# Patient Record
Sex: Male | Born: 1985 | Race: White | Hispanic: No | Marital: Single | Smoking: Former smoker
Health system: Southern US, Community
[De-identification: ages and names within clinical notes are randomized; demographics above are authoritative.]

## PROBLEM LIST (undated history)

## (undated) DIAGNOSIS — K8020 Calculus of gallbladder without cholecystitis without obstruction: Secondary | ICD-10-CM

## (undated) HISTORY — DX: Calculus of gallbladder without cholecystitis without obstruction: K80.20

---

## 2008-05-02 ENCOUNTER — Inpatient Hospital Stay (HOSPITAL_COMMUNITY): Admission: EM | Admit: 2008-05-02 | Discharge: 2008-05-06 | Payer: Self-pay | Admitting: Emergency Medicine

## 2008-05-02 ENCOUNTER — Ambulatory Visit: Payer: Self-pay | Admitting: Surgery

## 2008-05-22 ENCOUNTER — Ambulatory Visit: Payer: Self-pay | Admitting: Surgery

## 2008-12-11 ENCOUNTER — Ambulatory Visit: Payer: Self-pay | Admitting: Surgery

## 2009-06-04 ENCOUNTER — Ambulatory Visit: Payer: Self-pay | Admitting: Surgery

## 2009-10-18 ENCOUNTER — Ambulatory Visit: Payer: Self-pay | Admitting: Surgery

## 2010-02-04 ENCOUNTER — Ambulatory Visit: Admit: 2010-02-04 | Payer: Self-pay | Admitting: Surgery

## 2010-02-18 ENCOUNTER — Ambulatory Visit: Payer: Self-pay | Admitting: Surgery

## 2010-05-01 LAB — PROTIME-INR
INR: 1.7 — ABNORMAL HIGH (ref 0.00–1.49)
INR: 1.7 — ABNORMAL HIGH (ref 0.00–1.49)
INR: 1.3 (ref 0.00–1.49)
INR: 1.6 — ABNORMAL HIGH (ref 0.00–1.49)
Prothrombin Time: 20.3 seconds — ABNORMAL HIGH (ref 11.6–15.2)
Prothrombin Time: 21.2 seconds — ABNORMAL HIGH (ref 11.6–15.2)
Prothrombin Time: 17 seconds — ABNORMAL HIGH (ref 11.6–15.2)

## 2010-05-01 LAB — TYPE AND SCREEN
ABO/RH(D): A POS
Antibody Screen: NEGATIVE

## 2010-05-01 LAB — DIFFERENTIAL
Basophils Absolute: 0.1 10*3/uL (ref 0.0–0.1)
Basophils Relative: 1 % (ref 0–1)
Eosinophils Absolute: 0.2 10*3/uL (ref 0.0–0.7)
Eosinophils Relative: 2 % (ref 0–5)
Lymphocytes Relative: 33 % (ref 12–46)
Lymphs Abs: 3.2 10*3/uL (ref 0.7–4.0)
Monocytes Absolute: 0.5 10*3/uL (ref 0.1–1.0)
Monocytes Relative: 5 % (ref 3–12)
Neutro Abs: 5.8 10*3/uL (ref 1.7–7.7)
Neutrophils Relative %: 60 % (ref 43–77)

## 2010-05-01 LAB — BASIC METABOLIC PANEL
BUN: 8 mg/dL (ref 6–23)
CO2: 29 mEq/L (ref 19–32)
CO2: 27 mEq/L (ref 19–32)
Calcium: 7.9 mg/dL — ABNORMAL LOW (ref 8.4–10.5)
Calcium: 7.6 mg/dL — ABNORMAL LOW (ref 8.4–10.5)
Chloride: 108 mEq/L (ref 96–112)
Creatinine, Ser: 0.82 mg/dL (ref 0.4–1.5)
Creatinine, Ser: 0.95 mg/dL (ref 0.4–1.5)
GFR calc Af Amer: >60 mL/min (ref >60)
GFR calc Af Amer: >60 mL/min (ref >60)
GFR calc Af Amer: >60 mL/min (ref >60)
GFR calc non Af Amer: >60 mL/min (ref >60)
GFR calc non Af Amer: >60 mL/min (ref >60)
GFR calc non Af Amer: >60 mL/min (ref >60)
Glucose, Bld: 107 mg/dL — ABNORMAL HIGH (ref 70–99)
Glucose, Bld: 110 mg/dL — ABNORMAL HIGH (ref 70–99)
Potassium: 3.6 mEq/L (ref 3.5–5.1)
Potassium: 3.8 mEq/L (ref 3.5–5.1)
Sodium: 136 mEq/L (ref 135–145)
Sodium: 138 mEq/L (ref 135–145)

## 2010-05-01 LAB — CBC
HCT: 28.2 % — ABNORMAL LOW (ref 39.0–52.0)
HCT: 31.1 % — ABNORMAL LOW (ref 39.0–52.0)
HCT: 41.2 % (ref 39.0–52.0)
Hemoglobin: 9.8 g/dL — ABNORMAL LOW (ref 13.0–17.0)
Hemoglobin: 10.9 g/dL — ABNORMAL LOW (ref 13.0–17.0)
Hemoglobin: 10.9 g/dL — ABNORMAL LOW (ref 13.0–17.0)
Hemoglobin: 14.2 g/dL (ref 13.0–17.0)
MCHC: 34.6 g/dL (ref 30.0–36.0)
MCHC: 34.4 g/dL (ref 30.0–36.0)
MCV: 89.7 fL (ref 78.0–100.0)
MCV: 88.8 fL (ref 78.0–100.0)
Platelets: 127 10*3/uL — ABNORMAL LOW (ref 150–400)
Platelets: 141 10*3/uL — ABNORMAL LOW (ref 150–400)
Platelets: 240 10*3/uL (ref 150–400)
RBC: 3.14 MIL/uL — ABNORMAL LOW (ref 4.22–5.81)
RBC: 3.45 MIL/uL — ABNORMAL LOW (ref 4.22–5.81)
RBC: 4.64 MIL/uL (ref 4.22–5.81)
RDW: 12.9 % (ref 11.5–15.5)
RDW: 12.6 % (ref 11.5–15.5)
RDW: 12.7 % (ref 11.5–15.5)
WBC: 4.7 10*3/uL (ref 4.0–10.5)
WBC: 6.9 10*3/uL (ref 4.0–10.5)
WBC: 9.7 10*3/uL (ref 4.0–10.5)

## 2010-05-01 LAB — ABO/RH: ABO/RH(D): A POS

## 2010-05-01 LAB — POCT I-STAT 7, (LYTES, BLD GAS, ICA,H+H)
Calcium, Ion: 1.06 mmol/L — ABNORMAL LOW (ref 1.12–1.32)
Hemoglobin: 8.8 g/dL — ABNORMAL LOW (ref 13.0–17.0)
O2 Saturation: 100 %
TCO2: 29 mmol/L (ref 0–100)
pCO2 arterial: 50 mmHg — ABNORMAL HIGH (ref 35.0–45.0)
pO2, Arterial: 534 mmHg — ABNORMAL HIGH (ref 80.0–100.0)

## 2010-05-01 LAB — POCT I-STAT 4, (NA,K, GLUC, HGB,HCT)
Glucose, Bld: 98 mg/dL (ref 70–99)
HCT: 35 % — ABNORMAL LOW (ref 39.0–52.0)
Hemoglobin: 11.9 g/dL — ABNORMAL LOW (ref 13.0–17.0)
Potassium: 5.1 mEq/L (ref 3.5–5.1)
Sodium: 139 mEq/L (ref 135–145)

## 2010-05-01 LAB — POCT I-STAT, CHEM 8
Calcium, Ion: 1.05 mmol/L — ABNORMAL LOW (ref 1.12–1.32)
Glucose, Bld: 138 mg/dL — ABNORMAL HIGH (ref 70–99)
HCT: 43 % (ref 39.0–52.0)
Hemoglobin: 14.6 g/dL (ref 13.0–17.0)

## 2010-06-04 NOTE — Op Note (Signed)
NAMEMarland Kitchen  Dean Schultz, Dean Schultz NO.:  [id number]   MEDICAL RECORD NO.:  [id number]          PATIENT TYPE:  INP   LOCATION:  5014                         FACILITY:  [facility]   PHYSICIAN:  Nadara Mustard, MD     DATE OF BIRTH:  01/22/1985   DATE OF PROCEDURE:  DATE OF DISCHARGE:                               OPERATIVE REPORT   PREOPERATIVE DIAGNOSIS:  Open Gustilo-Anderson type IIIc left elbow  supracondylar fracture with intra-articular fracture.   POSTOPERATIVE DIAGNOSIS:  Open Gustilo-Anderson type IIIc left elbow  supracondylar fracture with intra-articular fracture.   PROCEDURES:  1. Irrigation debridement skin, soft tissue, muscle, and bone with      pulsatile lavage.  2. Open reduction and internal fixation left intra-articular distal      humerus fracture.  3. Exploration and transposition of the ulnar nerve.  4. Median nerve repair with a 6 mm x 2.5 cm neurogenic graft.  5. Fasciotomies of the forearm.  6. Carpal tunnel release.  7. Repair of the biceps tendon, and brachialis muscle.  8. Traumatic wound laceration, repaired 10 cm in length.  9. Application of wound vac.  10.Dr. Myra Gianotti, vein graft repair of the brachial artery.   SURGEON:  Nadara Mustard, MD and V. Durene Cal IV, MD   ANESTHESIA:  General.   ESTIMATED BLOOD LOSS:  300 mL.   ANTIBIOTICS:  1 g of Kefzol and 60 mg gentamicin.   TOURNIQUET TIME:  None.   DRAINS:  None.   WOUND VAC:  A set of -125 mmHg.   DISPOSITION:  To PACU in stable condition.  The patient was started with  type and cross and transfused with 2 units of packed red blood cells.   INDICATIONS FOR PROCEDURE:  The patient is a 25 year old gentleman who  was at work.  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  The  patient was brought to the emergency room and was seen urgently by  orthopedic consultation.  The patient states that his injury  occurred at  approximately 11:30 and the patient was seen in the emergency room  approximately 12:30.  The patient was reported to have pulses at the  wrist and some sensation by the ER physician.  My evaluation at initial  presentation showed a large traumatic wound over the antecubital fossa  of the elbow.  The patient had no sensation of the entire hand both  palmar and dorsal.  No sensation of the median, radial, or ulnar nerve  distribution.  The patient did not have palpable pulses at the wrist.  Doppler was used and there was no dopplerable pulses at the radial or  ulnar artery.  Radiograph showed an intra-articular fracture,  supracondylar left elbow.  The patient's remainder of his examination  was essentially unremarkable.  He had no previous surgeries.  No  allergies.  No medications. Isolated injury to the left elbow, no head  trauma.   SOCIAL HISTORY:  The patient does smoke.  ASSESSMENT:  Open Gustilo-Anderson type IIIc left elbow supracondylar  humerus fracture.  Pulseless ischemic left hand without sensation.   PLAN:  Surgery was discussed with the patient and his both parents due  to the complete lack of circulation and complete lack of sensation.  Risks and benefits were discussed including infection, neurovascular  injury, contractures, loss of permanent function to the hand, loss of  circulation to the hand, need for additional surgery, as well as the  need for open reduction and internal fixation of bone, need to repair  the artery, repair the nerve, and repair the muscles.  Parents state  they understood and wished to proceed at this time.  The patient was  brought to the operating room and underwent general anesthetic.  After  adequate level of anesthesia obtained, the patient's arm was first  cleansed with Hibiclens and scrub and then prepped using Betadine paint  and draped into a sterile field.  The traumatic wound extended  transversely across the  antecubital fossa.  This was extended distally  on the radial aspect of the forearm and proximally on the medial aspect  of the humerus.  The skin, soft tissue, muscle and bone was irrigated  with pulse lavage.  Dr. Myra Gianotti was working in conjunction and for his  operative repair of the brachial artery, please see his operative  report.  Due to the complete dysvascular status of the forearm,  fasciotomies were performed for all compartments of the forearm.  The  patient also underwent a carpal tunnel release.  After irrigation  debridement of skin, soft tissue, and bone and after fasciotomies were  performed, the patient underwent open reduction and internal fixation of  the intra-articular fracture of the supracondylar humerus fracture.  This was fixed with a medial locking plate.  Prior to application of the  plate, the ulnar nerve was released and transposed.  After stabilization  of the humerus, Dr. Myra Gianotti repaired the brachial artery.  After repair  the brachial artery, the patient then underwent repair of the biceps  tendon back to its insertion, and fascial repair of the brachialis.  The  patient had a complete disruption of the median nerve with approximately  2 cm of the nerve being crushed.  This was resected and the nerve was  repaired with the elbow flexed without any tension on the arterial  repair or on the nerve repair with the elbow flexed to 90 degrees.  A  neurogen tube was used 6 mm in diameter and 2.5 cm in length, sutured in  place with 6-0 prolene.  The wound was again irrigated with normal  saline.  The brachialis and the biceps were both repaired.  The wounds  were again irrigated.  The traumatic wound, which was over 10 cm in  length was repaired.  The surgical incisions as well as the fasciotomies  were repaired and a wound vac was applied over all of the wounds  including the carpal tunnel and this was set to -125 mmHg continuous,  the wound vac had a good  suction.  The patient was placed in a splint  both sugar-tong and posterior splint to maintain the elbow at 90  degrees.  He was extubated and taken to PACU in stable condition.  The  patient had good dopplerable pulses at the wrist prior to leaving the  operating room.      Nadara Mustard, MD  Electronically Signed     MVD/MEDQ  D:  05/02/2008  T:  05/03/2008  Job:  [id number]

## 2010-06-04 NOTE — Assessment & Plan Note (Signed)
OFFICE VISIT   Dean Schultz, Dean Schultz  DOB:  [birth year]                                       05/22/2008  EAVWU#:[id number]   HISTORY:  This is a 25 year old gentleman who sustained a crush injury  to the left arm.  The bone and nerve components were repaired by Dr.  Lajoyce Corners.  I repaired a transected brachial artery using a segment of  reversed cephalic vein.  The patient comes in today for followup.  He  does have some function of his hand.  He is relatively insensate with  the exception of the dorsum of his hand.  He has a palpable radial  pulse.   With regard to his arterial repair, I would like to see him back in 6  months with a duplex ultrasound.  I have discussed with him the signs  and symptoms of acute thrombosis and told him to come see me emergently.  The patient is about ready to begin exercise rehabilitation.  There was  some redundancy built into his interposition graft.  I think as long as  he proceeds with slow range of motion that he should not have a problem  with regards to his bypass graft.   Jorge Ny, MD  Electronically Signed   VWB/MEDQ  D:  05/22/2008  T:  05/23/2008  Job:  [id number]   cc:   Nadara Mustard, MD

## 2010-06-04 NOTE — Op Note (Signed)
NAME:  Dean Schultz, Dean Schultz                 ACCOUNT NO.:  [id number]   MEDICAL RECORD NO.:  [id number]          PATIENT TYPE:  INP   LOCATION:  5014                         FACILITY:  [facility]   PHYSICIAN:  Juleen China IV, MDDATE OF BIRTH:  [birth year]   DATE OF PROCEDURE:  05/02/2008  DATE OF DISCHARGE:                               OPERATIVE REPORT   PREOPERATIVE DIAGNOSIS:  Ischemic left hand secondary to crush injury.   POSTOPERATIVE DIAGNOSIS:  Ischemic left hand secondary to crush injury.   PROCEDURE PERFORMED:  Left brachial artery bypass graft with ipsilateral  reversed cephalic vein.   SURGEON:  1. Charlena Cross, MD   ASSISTANT:  Jerold Coombe, PA   ANESTHESIA:  General.   BLOOD LOSS:  100 mL.   FINDINGS:  Transected brachial artery, severe crush injury with muscle  bone and nerve injury.   INDICATIONS:  This is a 25 year old gentleman who suffered a crush  injury to his left arm.  In the emergency department, he did not have  any sensation and minimal movement in his left arm.  He was taken to the  operating room by Dr. Lajoyce Corners.  I was asked to repair his brachial artery  injury.   PROCEDURE IN DETAIL:  The patient was identified in the holding area and  taken to operating room and was placed supine on table.  General  endotracheal anesthesia was administered.  The patient's left arm was  prepped and draped in standard sterile fashion.  A time-out was called.  Antibiotics were given.  Dr. Lajoyce Corners first began the procedure opening the  laceration in his arm.  Through this incision, the brachial artery was  identified and it was completely transected.  He placed a vascular clamp  on the proximal brachial artery.  Please see Dr. Audrie Lia note for the  orthopedic details of the procedure.  For my portion of the procedure, I  was able to find the distal brachial artery within the wound.  It was  mobilized distally, approximately 2 cm beyond where the transected end  was  identified.  It was at this level that I felt the artery was  uninjured.  Similarly, I mobilized the proximal brachial artery into an  uninjured portion of the artery where a vascular clamp was placed.  Dr.  Lajoyce Corners plated the distal humerus.  I then mobilized the cephalic vein,  which was in the uninjured portion of the arm.  The vein was  approximately 4 mm in diameter and I felt this was adequate to serve as  a conduit.  I did not feel harvesting the vein from an uninjured  extremity was necessary given this was outside the injured field.  Side  branches on the vein were ligated between 3-0 silk ties.  Once the  adequate amount of vein was obtained, it was ligated proximally and  distally with 2-0 silk ties.  The vein was then prepared on the back  table.  It was instilled with heparinized saline.  It dilated nicely.  Next, the patient was given systemic heparinization.  The injured  portion of the brachial artery was transected.  I then removed  approximately 0.5 cm of normal artery ensuring that I was back to a  healthy brachial artery.  The end was then spatulated.  It was flushed.  There was a good bleeding from the proximal brachial artery.  Next, the  cephalic vein was placed in reversed fashion.  It was also spatulated to  fit the size of the arteriotomy.  A running end-to-end anastomosis was  created with 6-0 Prolene.  After the anastomosis was completed, the  vascular clamp on the brachial artery was released.  There was excellent  pulsatile flow through the artery.  The artery was then reoccluded with  a serrefine clamp.  I then debrided the distal brachial artery removing  approximately a centimeter of uninjured artery ensuring that I was back  to non-traumatized brachial artery.  Approximately, a 5-cm vein conduit  was used.  The end of the brachial artery and the cephalic vein were  spatulated.  An end-to-end anastomosis was created with running 6-0  Prolene.  Prior to  completion, the artery was flushed in antegrade  fashion as well as retrograde fashion.  There was good back-bleeding.  The anastomosis was then secured.  Doppler was used to evaluate signals  in the radial and ulnar artery.  The patient had both radial and ulnar  artery signals.  The anastomosis was then reinspected.  Both ends were  hemostatic.  The patient was given 50 mg protamine to reverse the  heparin.  At this point, this concluded my portion of the procedure.  Dr. Lajoyce Corners continued to work on nerve and bone at this point in time.  Please see his operative note for the remaining details of the  procedure.      Jorge Ny, MD  Electronically Signed     VWB/MEDQ  D:  05/02/2008  T:  05/03/2008  Job:  [id number]

## 2010-06-04 NOTE — Consult Note (Signed)
NAME:  Dean Schultz, Dean Schultz                 ACCOUNT NO.:  [id number]   MEDICAL RECORD NO.:  [id number]          PATIENT TYPE:  INP   LOCATION:  5014                         FACILITY:  [facility]   PHYSICIAN:  Juleen China IV, MDDATE OF BIRTH:  Mar 23, 1985   DATE OF CONSULTATION:  05/02/2008  DATE OF DISCHARGE:                                 CONSULTATION   CONSULTING PHYSICIAN:  Nadara Mustard, MD   REASON FOR CONSULT:  Ischemic left arm.   HISTORY:  This is a 25 year old gentleman who suffered a crush injury to  his left arm.  The injury occurred just prior to his arrival in the  emergency department.  He came with a laceration and bleeding.  He is  unable to feel his hand.  He had minimal movement in his fingers.   Past medical history is none.   Medications are none.   Surgical history is none.   Social history is no drug abuse.   Allergies are none.   Review of systems is negative except for pain and no movement of the  hand.   PHYSICAL EXAMINATION:  GENERAL:  The patient is afebrile,  hemodynamically stable.  Generally, well developed, well nourished.  He  is in mild distress.  HEENT:  Normocephalic, atraumatic.  Pupils are equal.  NECK:  Supple.  RESPIRATORY:  Not labored.  CARDIOVASCULAR:  Regular rate and rhythm.  EXTREMITIES:  He has a laceration with bleeding from the antecubital  crease.  The patient does not have Doppler signals in his radial or  ulnar artery.  He has sluggish cap refill.  He cannot feel his fingers.  He can barely move his fingers.  He is in significant pain.   LABORATORY VALUES:  His hematocrit is 43.   ASSESSMENT AND PLAN:  Crush injury to the left hand.  There is concern  for arterial injury as the patient has what appears to be an ischemic  left hand.  He will be taken emergently to the operating room for  exploration and possible repair.      Jorge Ny, MD  Electronically Signed     VWB/MEDQ  D:  05/02/2008  T:  05/03/2008   Job:  [id number]

## 2010-06-04 NOTE — Procedures (Signed)
VASCULAR LAB EXAM   INDICATION:  Left brachial artery bypass graft with stable pain.   HISTORY:  Diabetes:  No.  Cardiac:  No.  Hypertension:  No.   EXAM:  Duplex, left upper extremity bypass graft.   IMPRESSION:  Patent left brachial artery bypass graft with no evidence  of stenosis.       _________________________________________  V. Charlena Cross, MD   AS/MEDQ  D:  06/04/2009  T:  06/04/2009  Job:  [id number]

## 2010-06-07 NOTE — Discharge Summary (Signed)
NAMEMarland Kitchen  MCLANE, ARORA NO.:  [id number]   MEDICAL RECORD NO.:  [id number]          PATIENT TYPE:  INP   LOCATION:  5014                         FACILITY:  [facility]   PHYSICIAN:  Nadara Mustard, MD     DATE OF BIRTH:  February 22, 1985   DATE OF ADMISSION:  05/02/2008  DATE OF DISCHARGE:  05/06/2008                               DISCHARGE SUMMARY   DIAGNOSIS:  Open fracture and dislocation left elbow with pulseless and  insensate left upper extremity.  Discharged to home in stable condition.   CONSULTATION:  1. Durene Cal IV, MD, vascular vein specialist.   MEDICATIONS:  Tylox, Lyrica, and Cymbalta.   Follow up in the office in 1 week.   HISTORY OF PRESENT ILLNESS:  The patient is a 25 year old gentleman who  was at work and sustained a crush injury of a concrete truck and a wall  sustaining an open fracture and dislocation with complete nerve and  artery disruption at the elbow.  The patient was urgently seen in the  emergency room as well as with vascular veins specialist consultation  and the patient was urgently brought to the OR for limb salvage of the  left upper extremity.  The patient's initial presentation showed a  pulseless insensate left upper extremity, a Gustilo-Anderson grade IIIC  fracture.  The patient's hospital course was essentially unremarkable.  He underwent irrigation and debridement of skin, soft tissue, muscle,  and bone.  He underwent open reduction and internal fixation of the left  distal humerus with application of a wound VAC.  He will underwent  exploration and transposition of the ulnar nerve.  The median nerve was  repaired with a 6 x 2.5-cm NeuroGen graft.  He underwent fasciotomies  and carpal tunnel release due to the pending compartment syndrome  secondary to the ischemia to the left upper extremity.  He underwent  repair of the biceps and brachialis as well as traumatic repair of a 10-  cm traumatic wound.  Dr. Juleen China  underwent a reconstruction  with vein graft of the brachial artery.  The patient received a Kefzol  for infection prophylaxis.  The patient was typed and crossed with 2  units of packed red blood cells.  Postoperatively, the patient  progressed well.  He had a good vascular inflow to the left upper  extremity.  His ulnar sensation did return; however, the median nerve  distribution did not show any sensory function, which was to be  expected.  His hemoglobin on postop day #2 was 10.9.  The wound Vac was  changed on postoperative day #2.  The patient showed no signs of  compartment syndrome.  He was kept on Kefzol and gentamicin with  anticipated discontinuing the wound VAC on Saturday for discharge to  home on Saturday.  The patient was discharged to home in stable  condition on May 06, 2008 with prescription for Tylox, Lyrica, and  Cymbalta with the wound VAC discontinued.  Sterile dressing was applied  and the elbow kept in flexion at 90 degrees to protect the nerve  and  vascular repair.      Nadara Mustard, MD  Electronically Signed     MVD/MEDQ  D:  07/05/2008  T:  07/05/2008  Job:  [id number]

## 2011-02-07 ENCOUNTER — Telehealth: Payer: Self-pay | Admitting: Family Medicine

## 2011-02-07 NOTE — Telephone Encounter (Signed)
Medical Records requested by Franchot Mimes. [employer] [phone] via Certified Mail.  Called Paralegal, Meredith Mody, and followed up.  Does not appear patient has been seen at [facility] location recently and explained that we would need another medical records release form from where patient had signed the release forms and originally requested records in October, 2012.  I will forward the certified letter and release to medical records upon receipt.  In meantime can you please review chart for previous requests from October of 2012 and follow up with the law firm and Trula Ore on status of records.? Thanks

## 2011-02-10 NOTE — Telephone Encounter (Signed)
Gena, Should I be working on this? Aram Beecham

## 2011-09-26 ENCOUNTER — Encounter: Payer: Self-pay | Admitting: Vascular Surgery

## 2018-07-09 ENCOUNTER — Encounter: Payer: Self-pay | Admitting: Emergency Medicine

## 2018-07-09 ENCOUNTER — Emergency Department
Admission: EM | Admit: 2018-07-09 | Discharge: 2018-07-09 | Disposition: A | Discharge status: Home / Self Care | Payer: 59 | Attending: Emergency Medicine | Admitting: Emergency Medicine

## 2018-07-09 ENCOUNTER — Other Ambulatory Visit: Payer: Self-pay

## 2018-07-09 ENCOUNTER — Emergency Department: Payer: 59

## 2018-07-09 DIAGNOSIS — R1011 Right upper quadrant pain: Secondary | ICD-10-CM | POA: Diagnosis not present

## 2018-07-09 LAB — COMPREHENSIVE METABOLIC PANEL
ALT: 43 U/L (ref 0–44)
AST: 33 U/L (ref 15–41)
Albumin: 4.5 g/dL (ref 3.5–5.0)
Alkaline Phosphatase: 66 U/L (ref 38–126)
Anion gap: 10 (ref 5–15)
BUN: 22 mg/dL — ABNORMAL HIGH (ref 6–20)
CO2: 25 mmol/L (ref 22–32)
Calcium: 9.3 mg/dL (ref 8.9–10.3)
Chloride: 103 mmol/L (ref 98–111)
Creatinine, Ser: 1.2 mg/dL (ref 0.61–1.24)
GFR calc Af Amer: >60 mL/min (ref >60)
GFR calc non Af Amer: >60 mL/min (ref >60)
Glucose, Bld: 134 mg/dL — ABNORMAL HIGH (ref 70–99)
Potassium: 3.6 mmol/L (ref 3.5–5.1)
Sodium: 138 mmol/L (ref 135–145)
Total Bilirubin: 0.7 mg/dL (ref 0.3–1.2)
Total Protein: 7.8 g/dL (ref 6.5–8.1)

## 2018-07-09 LAB — LIPASE, BLOOD: Lipase: 48 U/L (ref 11–51)

## 2018-07-09 LAB — URINALYSIS, COMPLETE (UACMP) WITH MICROSCOPIC
Bacteria, UA: NONE SEEN
Bilirubin Urine: NEGATIVE
Glucose, UA: NEGATIVE mg/dL
Hgb urine dipstick: NEGATIVE
Ketones, ur: NEGATIVE mg/dL
Leukocytes,Ua: NEGATIVE
Nitrite: NEGATIVE
Protein, ur: NEGATIVE mg/dL
Specific Gravity, Urine: 1.021 (ref 1.005–1.030)
pH: 6 (ref 5.0–8.0)

## 2018-07-09 LAB — CBC WITH DIFFERENTIAL/PLATELET
Abs Immature Granulocytes: 0.03 10*3/uL (ref 0.00–0.07)
Basophils Absolute: 0.1 10*3/uL (ref 0.0–0.1)
Basophils Relative: 1 %
Eosinophils Absolute: 0.3 10*3/uL (ref 0.0–0.5)
Eosinophils Relative: 4 %
HCT: 45.4 % (ref 39.0–52.0)
Hemoglobin: 15.4 g/dL (ref 13.0–17.0)
Immature Granulocytes: 0 %
Lymphocytes Relative: 36 %
Lymphs Abs: 3.3 10*3/uL (ref 0.7–4.0)
MCH: 29.7 pg (ref 26.0–34.0)
MCHC: 33.9 g/dL (ref 30.0–36.0)
MCV: 87.6 fL (ref 80.0–100.0)
Monocytes Absolute: 0.6 10*3/uL (ref 0.1–1.0)
Monocytes Relative: 7 %
Neutro Abs: 4.7 10*3/uL (ref 1.7–7.7)
Neutrophils Relative %: 52 %
Platelets: 205 10*3/uL (ref 150–400)
RBC: 5.18 MIL/uL (ref 4.22–5.81)
RDW: 11.8 % (ref 11.5–15.5)
WBC: 9.1 10*3/uL (ref 4.0–10.5)
nRBC: 0 % (ref 0.0–0.2)

## 2018-07-09 LAB — TROPONIN I: Troponin I: <0.03 ng/mL (ref <0.03)

## 2018-07-09 MED ORDER — FAMOTIDINE IN NACL 20-0.9 MG/50ML-% IV SOLN
20.0000 mg | Freq: Once | INTRAVENOUS | Status: AC
  Administered 2018-07-09: 20 mg via INTRAVENOUS
  Filled 2018-07-09: qty 50

## 2018-07-09 MED ORDER — SODIUM CHLORIDE 0.9 % IV BOLUS
1000.0000 mL | Freq: Once | INTRAVENOUS | Status: AC
  Administered 2018-07-09: 1000 mL via INTRAVENOUS

## 2018-07-09 MED ORDER — ONDANSETRON HCL 4 MG/2ML IJ SOLN
4.0000 mg | Freq: Once | INTRAMUSCULAR | Status: AC
  Administered 2018-07-09: 4 mg via INTRAVENOUS
  Filled 2018-07-09: qty 2

## 2018-07-09 MED ORDER — FAMOTIDINE 20 MG PO TABS: 20.0000 mg | ORAL_TABLET | Freq: Two times a day (BID) | ORAL | 0 refills | Status: AC

## 2018-07-09 NOTE — Discharge Instructions (Addendum)
1.  Start Pepcid 20 mg twice daily. 2.  Eat a bland diet x5 days, then slowly advance diet as tolerated. 3.  Return to the ER for worsening symptoms, persistent vomiting, difficulty breathing or other concerns.

## 2018-07-09 NOTE — ED Notes (Signed)
Patient returned from ultrasound.

## 2018-07-09 NOTE — ED Provider Notes (Signed)
[facility] Emergency Department Provider Note   ____________________________________________   First MD Initiated Contact with Patient 07/09/18 [phone]     (approximate)  I have reviewed the triage vital signs and the nursing notes.   HISTORY  Chief Complaint Abdominal Pain    HPI Dean Wender. is a 33 y.o. male who presents to the ED from home with a chief complaint of abdominal pain.  Patient reports right upper quadrant abdominal pain after eating hamburger, macaroni and cheese and ice cream cake.  Had one episode of vomiting.  Describes pain as aching as well as burning.  Denies recent fever, cough, chest pain, shortness of breath, diarrhea.  Denies recent travel, trauma or exposure to persons diagnosed with coronavirus.       Past medical history None  There are no active problems to display for this patient.   History reviewed. No pertinent surgical history.  Prior to Admission medications   Medication Sig Start Date End Date Taking? Authorizing Provider  famotidine (PEPCID) 20 MG tablet Take 1 tablet (20 mg total) by mouth 2 (two) times daily. 07/09/18   Paulette Blanch, MD    Allergies Patient has no known allergies.  No family history on file.  Social History Social History   Tobacco Use   Smoking status: Not on file  Substance Use Topics   Alcohol use: Not on file   Drug use: Not on file    Review of Systems  Constitutional: No fever/chills Eyes: No visual changes. ENT: No sore throat. Cardiovascular: Denies chest pain. Respiratory: Denies shortness of breath. Gastrointestinal: Positive for abdominal pain, nausea and vomiting.  No diarrhea.  No constipation. Genitourinary: Negative for dysuria. Musculoskeletal: Negative for back pain. Skin: Negative for rash. Neurological: Negative for headaches, focal weakness or numbness.   ____________________________________________   PHYSICAL EXAM:  VITAL SIGNS: ED Triage  Vitals  Enc Vitals Group     BP 07/09/18 0026 131/74     Pulse Rate 07/09/18 0026 60     Resp 07/09/18 0026 16     Temp 07/09/18 0026 97.9 F (36.6 C)     Temp Source 07/09/18 0026 Oral     SpO2 07/09/18 0026 100 %     Weight 07/09/18 0022 250 lb (113.4 kg)     Height 07/09/18 0022 5\' 10"  (1.778 m)     Head Circumference --      Peak Flow --      Pain Score 07/09/18 0021 8     Pain Loc --      Pain Edu? --      Excl. in [place]? --     Constitutional: Alert and oriented. Well appearing and in no acute distress. Eyes: Conjunctivae are normal. PERRL. EOMI. Head: Atraumatic. Nose: No congestion/rhinnorhea. Mouth/Throat: Mucous membranes are moist.  Oropharynx non-erythematous. Neck: No stridor.   Cardiovascular: Normal rate, regular rhythm. Grossly normal heart sounds.  Good peripheral circulation. Respiratory: Normal respiratory effort.  No retractions. Lungs CTAB. Gastrointestinal: Soft and mildly tender to palpation right upper quadrant without rebound or guarding. No distention. No abdominal bruits. No CVA tenderness. Musculoskeletal: No lower extremity tenderness nor edema.  No joint effusions. Neurologic:  Normal speech and language. No gross focal neurologic deficits are appreciated. No gait instability. Skin:  Skin is warm, dry and intact. No rash noted. Psychiatric: Mood and affect are normal. Speech and behavior are normal.  ____________________________________________   LABS (all labs ordered are listed, but only abnormal results  are displayed)  Labs Reviewed  COMPREHENSIVE METABOLIC PANEL - Abnormal; Notable for the following components:      Result Value   Glucose, Bld 134 (*)    BUN 22 (*)    All other components within normal limits  URINALYSIS, COMPLETE (UACMP) WITH MICROSCOPIC - Abnormal; Notable for the following components:   Color, Urine YELLOW (*)    APPearance HAZY (*)    All other components within normal limits  CBC WITH DIFFERENTIAL/PLATELET  LIPASE,  BLOOD  TROPONIN I   ____________________________________________  EKG  ED ECG REPORT I, Reniya Mcclees J, the attending physician, personally viewed and interpreted this ECG.   Date: 07/09/2018  EKG Time: 0033  Rate: 71  Rhythm: normal EKG, normal sinus rhythm  Axis: Normal  Intervals:none  ST&T Change: Nonspecific  ____________________________________________  RADIOLOGY  ED MD interpretation: Gallbladder polyp  Official radiology report(s): Koreas Abdomen Limited Ruq  Result Date: 07/09/2018 CLINICAL DATA:  Right upper quadrant pain. EXAM: ULTRASOUND ABDOMEN LIMITED RIGHT UPPER QUADRANT COMPARISON:  No prior. FINDINGS: Gallbladder: No gallstones noted. 4.5 mm non mobile, nonshadowing echo density noted the gallbladder consistent with a polyp. Gallbladder wall thickness 2.5 mm. Negative Murphy sign. Common bile duct: Diameter: 3.1 mm Liver: Increased echogenicity consistent fatty infiltration and/or hepatocellular disease. Portal vein is patent on color Doppler imaging with normal direction of blood flow towards the liver. IMPRESSION: 1. Small 4.5 mm gallbladder polyp. No gallstones or biliary distention. 2. Increased echogenicity liver consistent fatty infiltration or hepatocellular disease. Electronically Signed   By: Maisie Fushomas  Register   On: 07/09/2018 06:02    ____________________________________________   PROCEDURES  Procedure(s) performed (including Critical Care):  Procedures   ____________________________________________   INITIAL IMPRESSION / ASSESSMENT AND PLAN / ED COURSE  As part of my medical decision making, I reviewed the following data within the electronic MEDICAL RECORD NUMBER Nursing notes reviewed and incorporated, Labs reviewed, EKG interpreted, Old chart reviewed, Radiograph reviewed and Notes from prior ED visits     Dean BerkshireJohn R Goings Jr. was evaluated in Emergency Department on 07/09/2018 for the symptoms described in the history of present illness. He was evaluated  in the context of the global COVID-19 pandemic, which necessitated consideration that the patient might be at risk for infection with the SARS-CoV-2 virus that causes COVID-19. Institutional protocols and algorithms that pertain to the evaluation of patients at risk for COVID-19 are in a state of rapid change based on information released by regulatory bodies including the CDC and federal and state organizations. These policies and algorithms were followed during the patient's care in the ED.   33 year old male who presents to the ED for RUQ abdominal pain. Differential diagnosis includes, but is not limited to, biliary disease (biliary colic, acute cholecystitis, cholangitis, choledocholithiasis, etc), intrathoracic causes for epigastric abdominal pain including ACS, gastritis, duodenitis, pancreatitis, small bowel or large bowel obstruction, abdominal aortic aneurysm, hernia, and ulcer(s).  Laboratory results unremarkable.  Will initiate IV fluid resuscitation, IV Pepcid, Zofran and proceed with right upper quadrant abdominal ultrasound to evaluate cholecystitis.   Clinical Course as of Jul 08 612  Fri Jul 09, 2018  0609 Updated patient of ultrasound result.  He is resting comfortably no acute distress.  Will start daily Pepcid and he will follow-up with his PCP closely.  Strict return precautions given.  Patient verbalizes understanding and agrees with plan of care.   [JS]    Clinical Course User Index [JS] Irean HongSung, Myrick Mcnairy J, MD  ____________________________________________   FINAL CLINICAL IMPRESSION(S) / ED DIAGNOSES  Final diagnoses:  Right upper quadrant abdominal pain     ED Discharge Orders         Ordered    famotidine (PEPCID) 20 MG tablet  2 times daily     07/09/18 [id number]           Note:  This document was prepared using Dragon voice recognition software and may include unintentional dictation errors.   Irean HongSung, Xaivier Malay J, MD 07/09/18 [phone]

## 2018-07-09 NOTE — ED Notes (Signed)
Patient states having upper stomach pain around 2000 last night until around 0130-0200 with pain around a 8/10. Patient states pain is not as bad as it was earlier pain around 1/10. Patient had to episodes of emesis during episode.

## 2018-07-09 NOTE — ED Triage Notes (Signed)
Patient ambulatory to triage with steady gait, without difficulty or distress noted, in mask in place; pt reports upper abd pain radiating into back accomp by N/V x 3 hrs; denies hx of same

## 2018-11-01 ENCOUNTER — Emergency Department
Admission: EM | Admit: 2018-11-01 | Discharge: 2018-11-01 | Disposition: A | Discharge status: Home / Self Care | Payer: Self-pay | Attending: Emergency Medicine | Admitting: Emergency Medicine

## 2018-11-01 ENCOUNTER — Emergency Department: Payer: Self-pay

## 2018-11-01 ENCOUNTER — Encounter: Payer: Self-pay | Admitting: Emergency Medicine

## 2018-11-01 ENCOUNTER — Other Ambulatory Visit: Payer: Self-pay

## 2018-11-01 DIAGNOSIS — Z87891 Personal history of nicotine dependence: Secondary | ICD-10-CM | POA: Insufficient documentation

## 2018-11-01 DIAGNOSIS — K219 Gastro-esophageal reflux disease without esophagitis: Secondary | ICD-10-CM | POA: Insufficient documentation

## 2018-11-01 DIAGNOSIS — R1013 Epigastric pain: Secondary | ICD-10-CM | POA: Insufficient documentation

## 2018-11-01 LAB — BASIC METABOLIC PANEL
Anion gap: 7 (ref 5–15)
BUN: 18 mg/dL (ref 6–20)
CO2: 29 mmol/L (ref 22–32)
Calcium: 9.3 mg/dL (ref 8.9–10.3)
Chloride: 103 mmol/L (ref 98–111)
Creatinine, Ser: 1.49 mg/dL — ABNORMAL HIGH (ref 0.61–1.24)
GFR calc Af Amer: >60 mL/min (ref >60)
GFR calc non Af Amer: >60 mL/min (ref >60)
Glucose, Bld: 128 mg/dL — ABNORMAL HIGH (ref 70–99)
Potassium: 3.8 mmol/L (ref 3.5–5.1)
Sodium: 139 mmol/L (ref 135–145)

## 2018-11-01 LAB — LIPASE, BLOOD: Lipase: 36 U/L (ref 11–51)

## 2018-11-01 LAB — CBC
HCT: 45 % (ref 39.0–52.0)
Hemoglobin: 15.6 g/dL (ref 13.0–17.0)
MCH: 29.9 pg (ref 26.0–34.0)
MCHC: 34.7 g/dL (ref 30.0–36.0)
MCV: 86.2 fL (ref 80.0–100.0)
Platelets: 208 10*3/uL (ref 150–400)
RBC: 5.22 MIL/uL (ref 4.22–5.81)
RDW: 11.5 % (ref 11.5–15.5)
WBC: 9.6 10*3/uL (ref 4.0–10.5)
nRBC: 0 % (ref 0.0–0.2)

## 2018-11-01 LAB — HEPATIC FUNCTION PANEL
ALT: 30 U/L (ref 0–44)
AST: 28 U/L (ref 15–41)
Albumin: 4.3 g/dL (ref 3.5–5.0)
Alkaline Phosphatase: 58 U/L (ref 38–126)
Bilirubin, Direct: 0.2 mg/dL (ref 0.0–0.2)
Indirect Bilirubin: 0.6 mg/dL (ref 0.3–0.9)
Total Bilirubin: 0.8 mg/dL (ref 0.3–1.2)
Total Protein: 7.1 g/dL (ref 6.5–8.1)

## 2018-11-01 LAB — TROPONIN I (HIGH SENSITIVITY)
Troponin I (High Sensitivity): 8 ng/L (ref <18)
Troponin I (High Sensitivity): 3 ng/L (ref <18)

## 2018-11-01 MED ORDER — METOCLOPRAMIDE HCL 5 MG/ML IJ SOLN
10.0000 mg | Freq: Once | INTRAMUSCULAR | Status: AC
  Administered 2018-11-01: 10 mg via INTRAVENOUS
  Filled 2018-11-01: qty 2

## 2018-11-01 MED ORDER — DROPERIDOL 2.5 MG/ML IJ SOLN
2.5000 mg | Freq: Once | INTRAMUSCULAR | Status: AC
  Administered 2018-11-01: 2.5 mg via INTRAVENOUS
  Filled 2018-11-01: qty 2

## 2018-11-01 MED ORDER — SODIUM CHLORIDE 0.9 % IV BOLUS
1000.0000 mL | Freq: Once | INTRAVENOUS | Status: AC
  Administered 2018-11-01: 1000 mL via INTRAVENOUS

## 2018-11-01 MED ORDER — PANTOPRAZOLE SODIUM 40 MG PO TBEC: 40.0000 mg | DELAYED_RELEASE_TABLET | Freq: Every day | ORAL | 1 refills | Status: AC

## 2018-11-01 MED ORDER — FAMOTIDINE IN NACL 20-0.9 MG/50ML-% IV SOLN
20.0000 mg | Freq: Once | INTRAVENOUS | Status: AC
  Administered 2018-11-01: 20 mg via INTRAVENOUS
  Filled 2018-11-01: qty 50

## 2018-11-01 MED ORDER — ONDANSETRON HCL 4 MG/2ML IJ SOLN
4.0000 mg | Freq: Once | INTRAMUSCULAR | Status: AC
  Administered 2018-11-01: 4 mg via INTRAVENOUS

## 2018-11-01 MED ORDER — FENTANYL CITRATE (PF) 100 MCG/2ML IJ SOLN
50.0000 ug | Freq: Once | INTRAMUSCULAR | Status: AC
  Administered 2018-11-01: 50 ug via INTRAVENOUS
  Filled 2018-11-01: qty 2

## 2018-11-01 MED ORDER — ONDANSETRON HCL 4 MG/2ML IJ SOLN
INTRAMUSCULAR | Status: AC
  Filled 2018-11-01: qty 2

## 2018-11-01 MED ORDER — FENTANYL CITRATE (PF) 100 MCG/2ML IJ SOLN
50.0000 ug | Freq: Once | INTRAMUSCULAR | Status: AC
  Administered 2018-11-01: 03:00:00 50 ug via INTRAVENOUS
  Filled 2018-11-01: qty 2

## 2018-11-01 NOTE — ED Triage Notes (Signed)
Pt arrives POV to triage with c/o upeer epigastric pain which radiates to his back. Pt was awoken from his sleep and vomited with the pain. Pt is diaphoretic at this time in triage.

## 2018-11-01 NOTE — Discharge Instructions (Signed)
Follow-up with GI.  Take Protonix every evening.  Return to the emergency room for new or worsening abdominal pain, chest pain, fever.

## 2018-11-01 NOTE — ED Notes (Signed)
US in with patient.

## 2018-11-01 NOTE — ED Provider Notes (Signed)
[facility] Emergency Department Provider Note  ____________________________________________  Time seen: Approximately 3:12 AM  I have reviewed the triage vital signs and the nursing notes.   HISTORY  Chief Complaint Chest Pain   HPI Dean Schultz. is a 33 y.o. male with no known past medical history who presents for evaluation of epigastric abdominal pain.  Patient describes the pain as burning and sharp, constant and severe radiating to his back and associated with nausea and several episodes of nonbloody nonbilious emesis.  Patient reports that he had pot roast this evening and the pain started a couple of hours after that.  No diarrhea, no chest pain or shortness of breath, no fever cough, no constipation, no prior abdominal surgeries.  Patient is a former smoker.  Denies alcohol or NSAID use, denies history of ulcer disease.  Denies any drug use.  No personal or family history of heart attacks, PE or DVT, no recent travel immobilization, no leg pain or swelling, no hemoptysis or exogenous hormones.  Patient was seen here back in June for similar complaint and at that time was sent for right upper quadrant ultrasound which showed a gallbladder polyp but no other acute findings.   PMH None - reviewed  Prior to Admission medications   Medication Sig Start Date End Date Taking? Authorizing Provider  famotidine (PEPCID) 20 MG tablet Take 1 tablet (20 mg total) by mouth 2 (two) times daily. 07/09/18   Irean Hong, MD  pantoprazole (PROTONIX) 40 MG tablet Take 1 tablet (40 mg total) by mouth at bedtime. 11/01/18 11/01/19  Nita Sickle, MD    Allergies Patient has no known allergies.  No family history on file.  Social History Social History   Tobacco Use   Smoking status: Former Smoker   Smokeless tobacco: Never Used  Substance Use Topics   Alcohol use: Never    Frequency: Never   Drug use: Never    Review of Systems  Constitutional:  Negative for fever. Eyes: Negative for visual changes. ENT: Negative for sore throat. Neck: No neck pain  Cardiovascular: Negative for chest pain. Respiratory: Negative for shortness of breath. Gastrointestinal: + epigastric abdominal pain, nausea, and vomiting. No diarrhea. Genitourinary: Negative for dysuria. Musculoskeletal: Negative for back pain. Skin: Negative for rash. Neurological: Negative for headaches, weakness or numbness. Psych: No SI or HI  ____________________________________________   PHYSICAL EXAM:  VITAL SIGNS: ED Triage Vitals  Enc Vitals Group     BP 11/01/18 0226 128/78     Pulse Rate 11/01/18 0226 70     Resp 11/01/18 0226 18     Temp 11/01/18 0226 98 F (36.7 C)     Temp Source 11/01/18 0226 Oral     SpO2 11/01/18 0226 99 %     Weight 11/01/18 0225 240 lb (108.9 kg)     Height 11/01/18 0225 5\' 10"  (1.778 m)     Head Circumference --      Peak Flow --      Pain Score 11/01/18 0225 7     Pain Loc --      Pain Edu? --      Excl. in [place]? --     Constitutional: Alert and oriented, clammy and actively vomiting.  HEENT:      Head: Normocephalic and atraumatic.         Eyes: Conjunctivae are normal. Sclera is non-icteric.       Mouth/Throat: Mucous membranes are moist.  Neck: Supple with no signs of meningismus. Cardiovascular: Regular rate and rhythm. No murmurs, gallops, or rubs. 2+ symmetrical distal pulses are present in all extremities. No JVD. Respiratory: Normal respiratory effort. Lungs are clear to auscultation bilaterally. No wheezes, crackles, or rhonchi.  Gastrointestinal: Soft, tender to palpation in all upper quadrants worse in the RUQ and epigastric region, and non distended with positive bowel sounds. No rebound or guarding. Genitourinary: No CVA tenderness. Musculoskeletal: Nontender with normal range of motion in all extremities. No edema, cyanosis, or erythema of extremities. Neurologic: Normal speech and language. Face is  symmetric. Moving all extremities. No gross focal neurologic deficits are appreciated. Skin: Skin is warm, dry and intact. No rash noted. Psychiatric: Mood and affect are normal. Speech and behavior are normal.  ____________________________________________   LABS (all labs ordered are listed, but only abnormal results are displayed)  Labs Reviewed  BASIC METABOLIC PANEL - Abnormal; Notable for the following components:      Result Value   Glucose, Bld 128 (*)    Creatinine, Ser 1.49 (*)    All other components within normal limits  CBC  HEPATIC FUNCTION PANEL  LIPASE, BLOOD  TROPONIN I (HIGH SENSITIVITY)  TROPONIN I (HIGH SENSITIVITY)   ____________________________________________  EKG  ED ECG REPORT I, Nita Sicklearolina Kourtlynn Trevor, the attending physician, personally viewed and interpreted this ECG.  Normal sinus rhythm, rate of 70, normal intervals, normal axis, no ST elevations or depressions. ____________________________________________  RADIOLOGY  I have personally reviewed the images performed during this visit and I agree with the Radiologist's read.   Interpretation by Radiologist:  Dg Chest 2 View  Result Date: 11/01/2018 CLINICAL DATA:  Chest pain EXAM: CHEST - 2 VIEW COMPARISON:  None. FINDINGS: No consolidation, features of edema, pneumothorax, or effusion. Pulmonary vascularity is normally distributed. The cardiomediastinal contours are unremarkable. No acute osseous or soft tissue abnormality. IMPRESSION: No acute cardiopulmonary abnormality. Electronically Signed   By: Kreg ShropshirePrice  DeHay M.D.   On: 11/01/2018 02:51   [place] Abdomen Limited Ruq  Result Date: 11/01/2018 CLINICAL DATA:  Epigastric abdominal pain with nausea and vomiting EXAM: ULTRASOUND ABDOMEN LIMITED RIGHT UPPER QUADRANT COMPARISON:  07/09/2018 FINDINGS: Gallbladder: No gallstones or wall thickening visualized. No sonographic Murphy sign noted by sonographer. Previously reported polyp is not seen today Common  bile duct: Diameter: 4 mm Liver: Increased liver echogenicity. No focal lesion. Portal vein is patent on color Doppler imaging with normal direction of blood flow towards the liver. IMPRESSION: 1. No acute finding or gallstone. 2. Hepatic steatosis. Electronically Signed   By: Marnee SpringJonathon  Watts M.D.   On: 11/01/2018 05:23     ____________________________________________   PROCEDURES  Procedure(s) performed: None Procedures Critical Care performed:  None ____________________________________________   INITIAL IMPRESSION / ASSESSMENT AND PLAN / ED COURSE   33 y.o. male with no known past medical history who presents for evaluation of epigastric abdominal pain, nausea vomiting.  Patient is clammy and actively vomiting, tender to palpation in the upper quadrants worse in the right upper quadrant and epigastric region with no rebound or guarding.  EKG with no ischemic changes.  Differential diagnosis including GERD versus peptic ulcer disease versus pancreatitis versus gallbladder disease.  Less likely appendicitis or diverticulitis with no lower quadrant tenderness.  Will start patient on IV Pepcid, Zofran and fentanyl.  Will check labs and x-ray.  Patient did undergo ultrasound of his gallbladder in June for similar presentation which showed a polyp but no other signs of acute cholecystitis.  Clinical Course  as of Oct 31 604  Mon Nov 01, 2018  7124 Pain resolved.  Patient passed p.o. challenge with no further episodes of vomiting.  Right upper quadrant ultrasound negative for cholelithiasis or cholecystitis.  Labs with no significant acute findings.  Presentation most likely concerning for GERD versus peptic ulcer disease.  Will refer to GI.  Will start patient on Protonix.  Discussed return precautions with patient and his wife.   [CV]    Clinical Course User Index [CV] Alfred Levins [place], MD      As part of my medical decision making, I reviewed the following data within the [place] notes reviewed and incorporated, Labs reviewed , EKG interpreted , Old EKG reviewed, Old chart reviewed, Radiograph reviewed , Notes from prior ED visits and [place] Controlled Substance Database   Patient was evaluated in Emergency Department today for the symptoms described in the history of present illness. Patient was evaluated in the context of the global COVID-19 pandemic, which necessitated consideration that the patient might be at risk for infection with the SARS-CoV-2 virus that causes COVID-19. Institutional protocols and algorithms that pertain to the evaluation of patients at risk for COVID-19 are in a state of rapid change based on information released by regulatory bodies including the CDC and federal and state organizations. These policies and algorithms were followed during the patient's care in the ED.   ____________________________________________   FINAL CLINICAL IMPRESSION(S) / ED DIAGNOSES   Final diagnoses:  Epigastric abdominal pain  Gastroesophageal reflux disease, unspecified whether esophagitis present      NEW MEDICATIONS STARTED DURING THIS VISIT:  ED Discharge Orders         Ordered    pantoprazole (PROTONIX) 40 MG tablet  Daily at bedtime     11/01/18 0542           Note:  This document was prepared using Dragon voice recognition software and may include unintentional dictation errors.    Rudene Re, MD 11/01/18 [phone]

## 2019-03-08 ENCOUNTER — Encounter: Payer: Self-pay | Admitting: *Deleted

## 2019-03-08 ENCOUNTER — Telehealth: Payer: Self-pay | Admitting: Emergency Medicine

## 2019-03-08 ENCOUNTER — Emergency Department
Admission: EM | Admit: 2019-03-08 | Discharge: 2019-03-08 | Disposition: A | Discharge status: Home / Self Care | Payer: Self-pay | Attending: Emergency Medicine | Admitting: Emergency Medicine

## 2019-03-08 ENCOUNTER — Other Ambulatory Visit: Payer: Self-pay

## 2019-03-08 DIAGNOSIS — R109 Unspecified abdominal pain: Secondary | ICD-10-CM | POA: Insufficient documentation

## 2019-03-08 DIAGNOSIS — Z5321 Procedure and treatment not carried out due to patient leaving prior to being seen by health care provider: Secondary | ICD-10-CM | POA: Insufficient documentation

## 2019-03-08 LAB — CBC
HCT: 47.3 % (ref 39.0–52.0)
Hemoglobin: 16.1 g/dL (ref 13.0–17.0)
MCH: 29.7 pg (ref 26.0–34.0)
MCHC: 34 g/dL (ref 30.0–36.0)
MCV: 87.1 fL (ref 80.0–100.0)
Platelets: 235 10*3/uL (ref 150–400)
RBC: 5.43 MIL/uL (ref 4.22–5.81)
RDW: 11.6 % (ref 11.5–15.5)
WBC: 8.6 10*3/uL (ref 4.0–10.5)
nRBC: 0 % (ref 0.0–0.2)

## 2019-03-08 LAB — COMPREHENSIVE METABOLIC PANEL
ALT: 44 U/L (ref 0–44)
AST: 28 U/L (ref 15–41)
Albumin: 4.3 g/dL (ref 3.5–5.0)
Alkaline Phosphatase: 62 U/L (ref 38–126)
Anion gap: 8 (ref 5–15)
BUN: 16 mg/dL (ref 6–20)
CO2: 29 mmol/L (ref 22–32)
Calcium: 9.1 mg/dL (ref 8.9–10.3)
Chloride: 102 mmol/L (ref 98–111)
Creatinine, Ser: 1.17 mg/dL (ref 0.61–1.24)
GFR calc Af Amer: >60 mL/min (ref >60)
GFR calc non Af Amer: >60 mL/min (ref >60)
Glucose, Bld: 132 mg/dL — ABNORMAL HIGH (ref 70–99)
Potassium: 3.8 mmol/L (ref 3.5–5.1)
Sodium: 139 mmol/L (ref 135–145)
Total Bilirubin: 0.6 mg/dL (ref 0.3–1.2)
Total Protein: 7.5 g/dL (ref 6.5–8.1)

## 2019-03-08 LAB — LIPASE, BLOOD: Lipase: 35 U/L (ref 11–51)

## 2019-03-08 MED ORDER — SODIUM CHLORIDE 0.9% FLUSH: 3.0000 mL | Freq: Once | INTRAVENOUS | Status: DC

## 2019-03-08 NOTE — Telephone Encounter (Signed)
Called patient due to lwot to inquire about condition and follow up plans. Left message.   

## 2019-03-08 NOTE — ED Notes (Signed)
Pt noted leaving ED lobby after inquiring over wait time 

## 2019-03-08 NOTE — ED Triage Notes (Signed)
Pt ambulatory to triage.  Pt has abd pain with vomiting.  No diarrhea.  Pt also reports mid back pain.  No urinary sx.  Pt alert  Speech clear.

## 2019-03-08 NOTE — ED Notes (Signed)
Pt unable to void at this time. 

## 2020-10-26 IMAGING — US ULTRASOUND ABDOMEN LIMITED
1 series · 14 of 25 positions shown · non-contrast
Comparison: No prior.

CLINICAL DATA: Right upper quadrant pain.

EXAM:
ULTRASOUND ABDOMEN LIMITED RIGHT UPPER QUADRANT

[Series 1: ultrasound abdomen limited · 0.21mm/px · 14 of 42 slices shown]
[im 1/42]
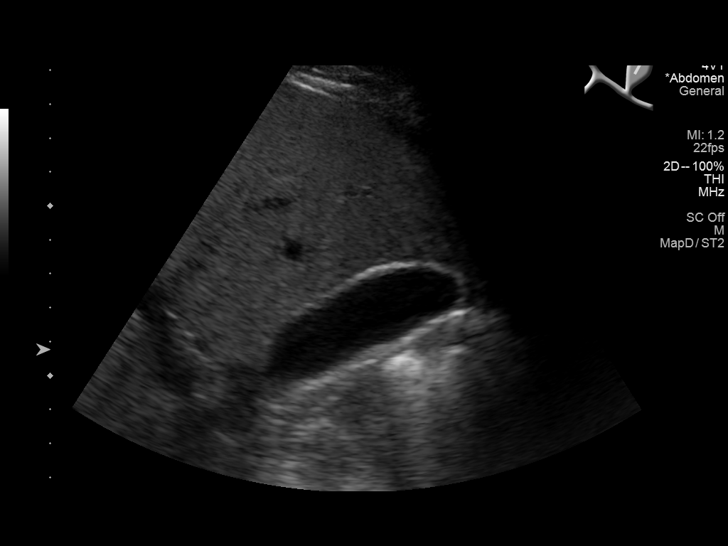
[im 4/42]
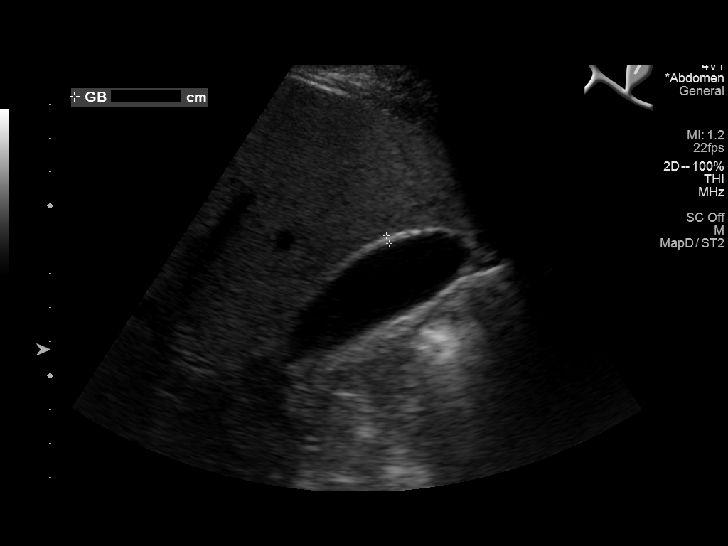
[im 7/42]
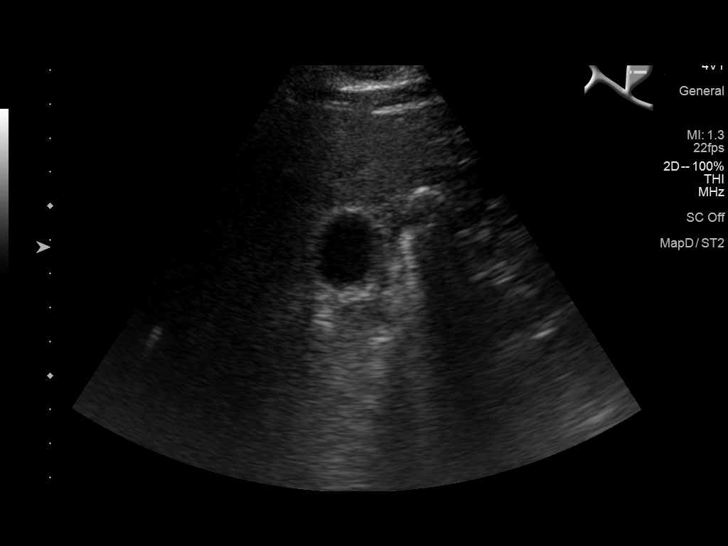
[im 11/42]
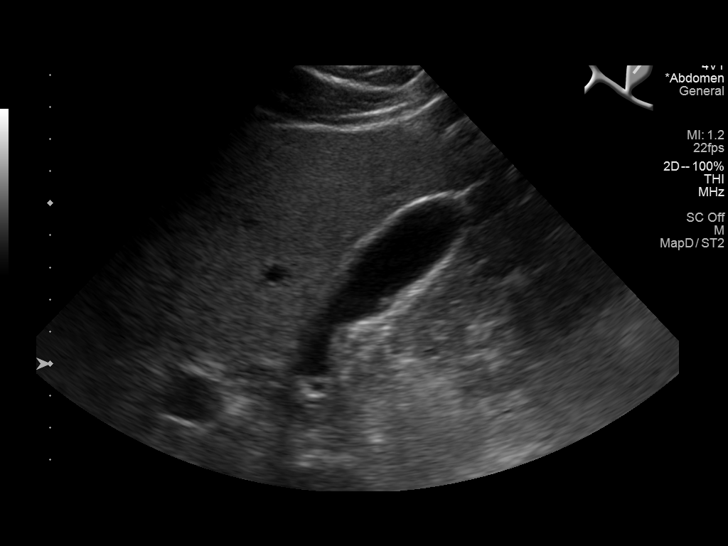
[im 14/42]
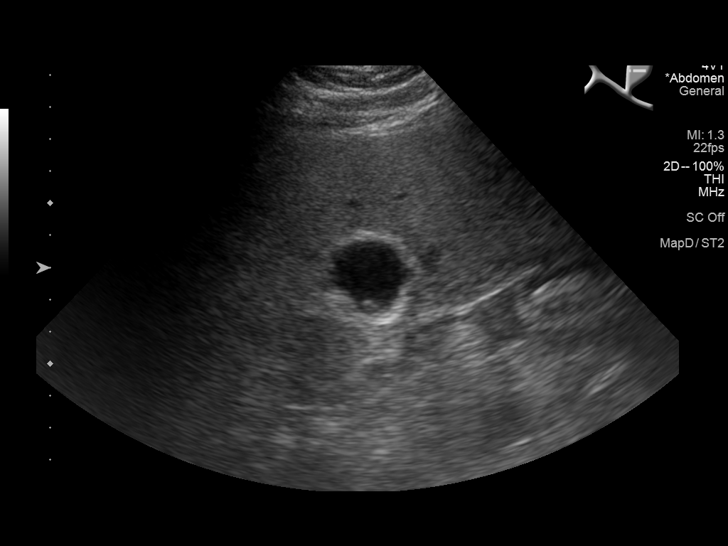
[im 16/42]
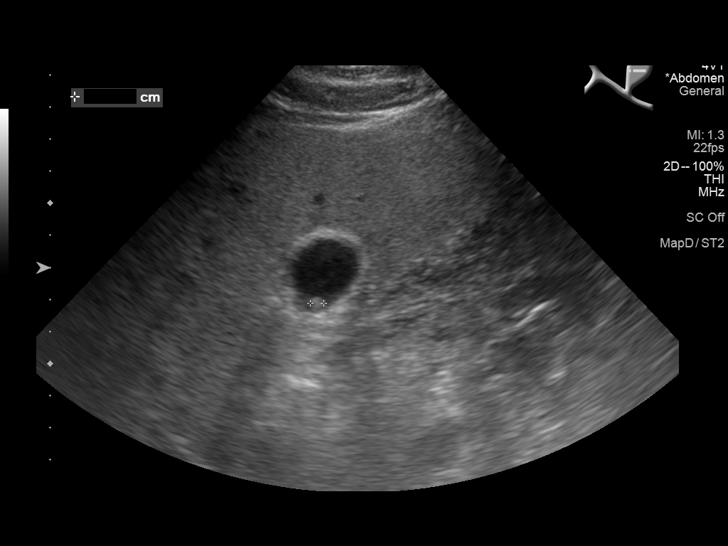
[im 19/42]
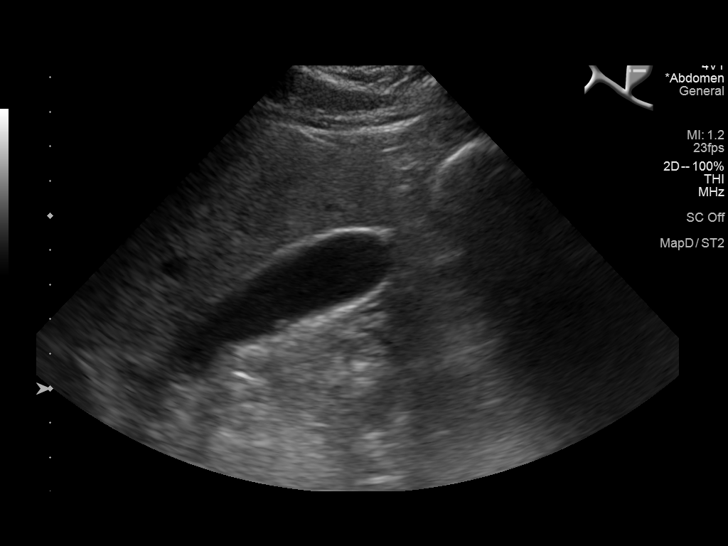
[im 23/42]
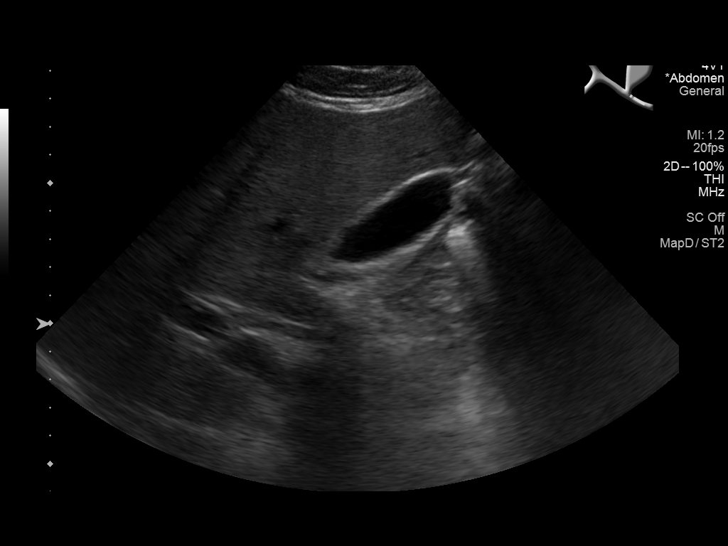
[im 26/42]
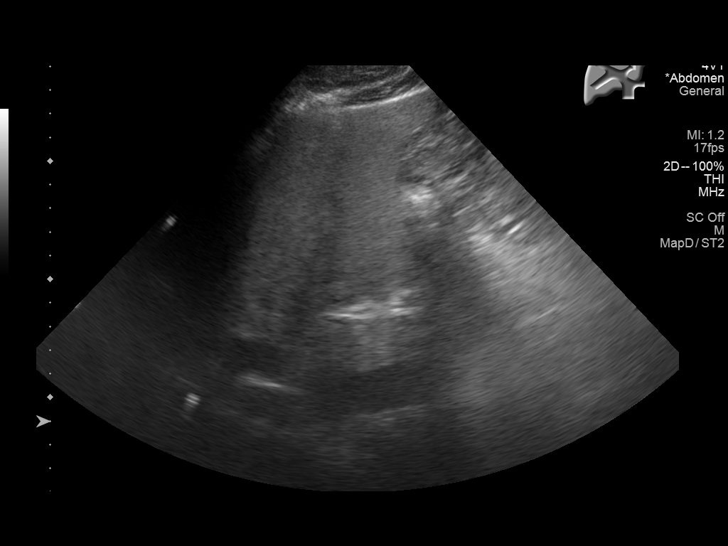
[im 28/42]
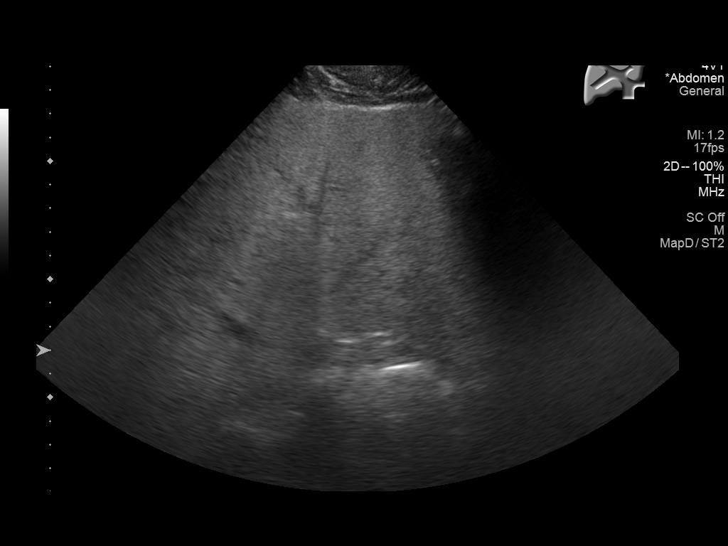
[im 31/42]
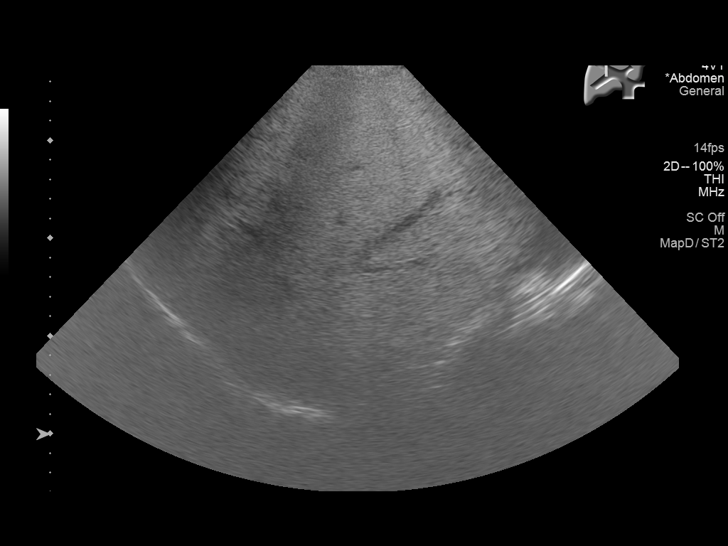
[im 35/42]
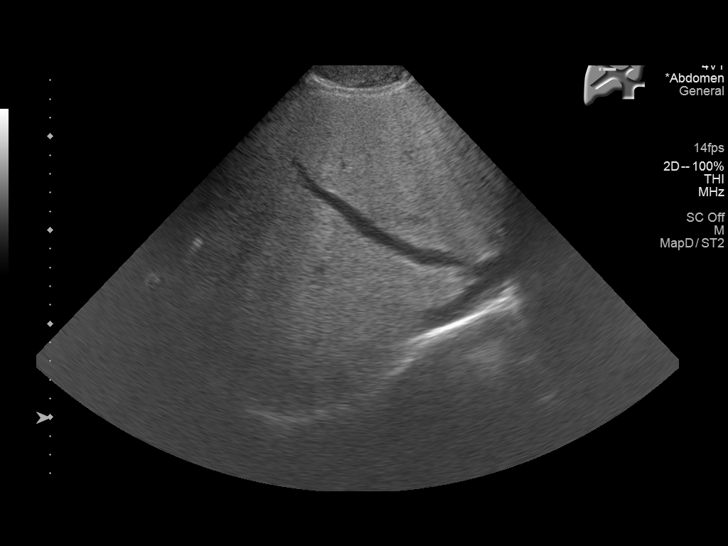
[im 38/42]
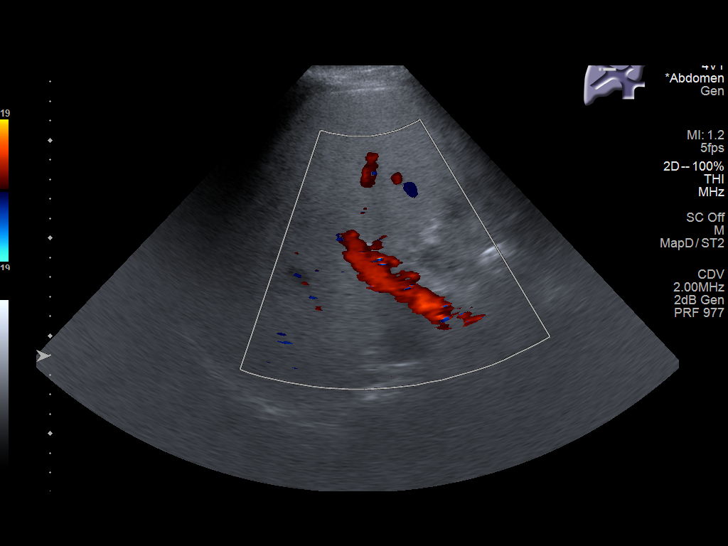
[im 42/42]
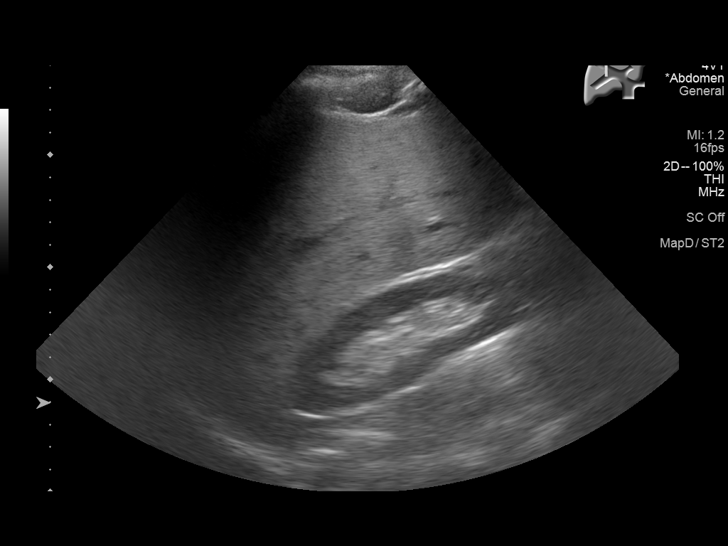

[14 of 25 positions shown; findings below may reference images not displayed]

FINDINGS: Gallbladder:

No gallstones noted. 4.5 mm non mobile, nonshadowing echo density
noted the gallbladder consistent with a polyp. Gallbladder wall
thickness 2.5 mm. Negative Murphy sign.

Common bile duct:

Diameter: 3.1 mm

Liver:

Increased echogenicity consistent fatty infiltration and/or
hepatocellular disease. Portal vein is patent on color Doppler
imaging with normal direction of blood flow towards the liver.
IMPRESSION: 1. Small 4.5 mm gallbladder polyp. No gallstones or biliary
distention.

2. Increased echogenicity liver consistent fatty infiltration or
hepatocellular disease.

## 2021-02-18 IMAGING — CR DG CHEST 2V
2 series · 2 of 2 positions shown · non-contrast
Comparison: None.

CLINICAL DATA: Chest pain

EXAM:
CHEST - 2 VIEW

[chest pa]
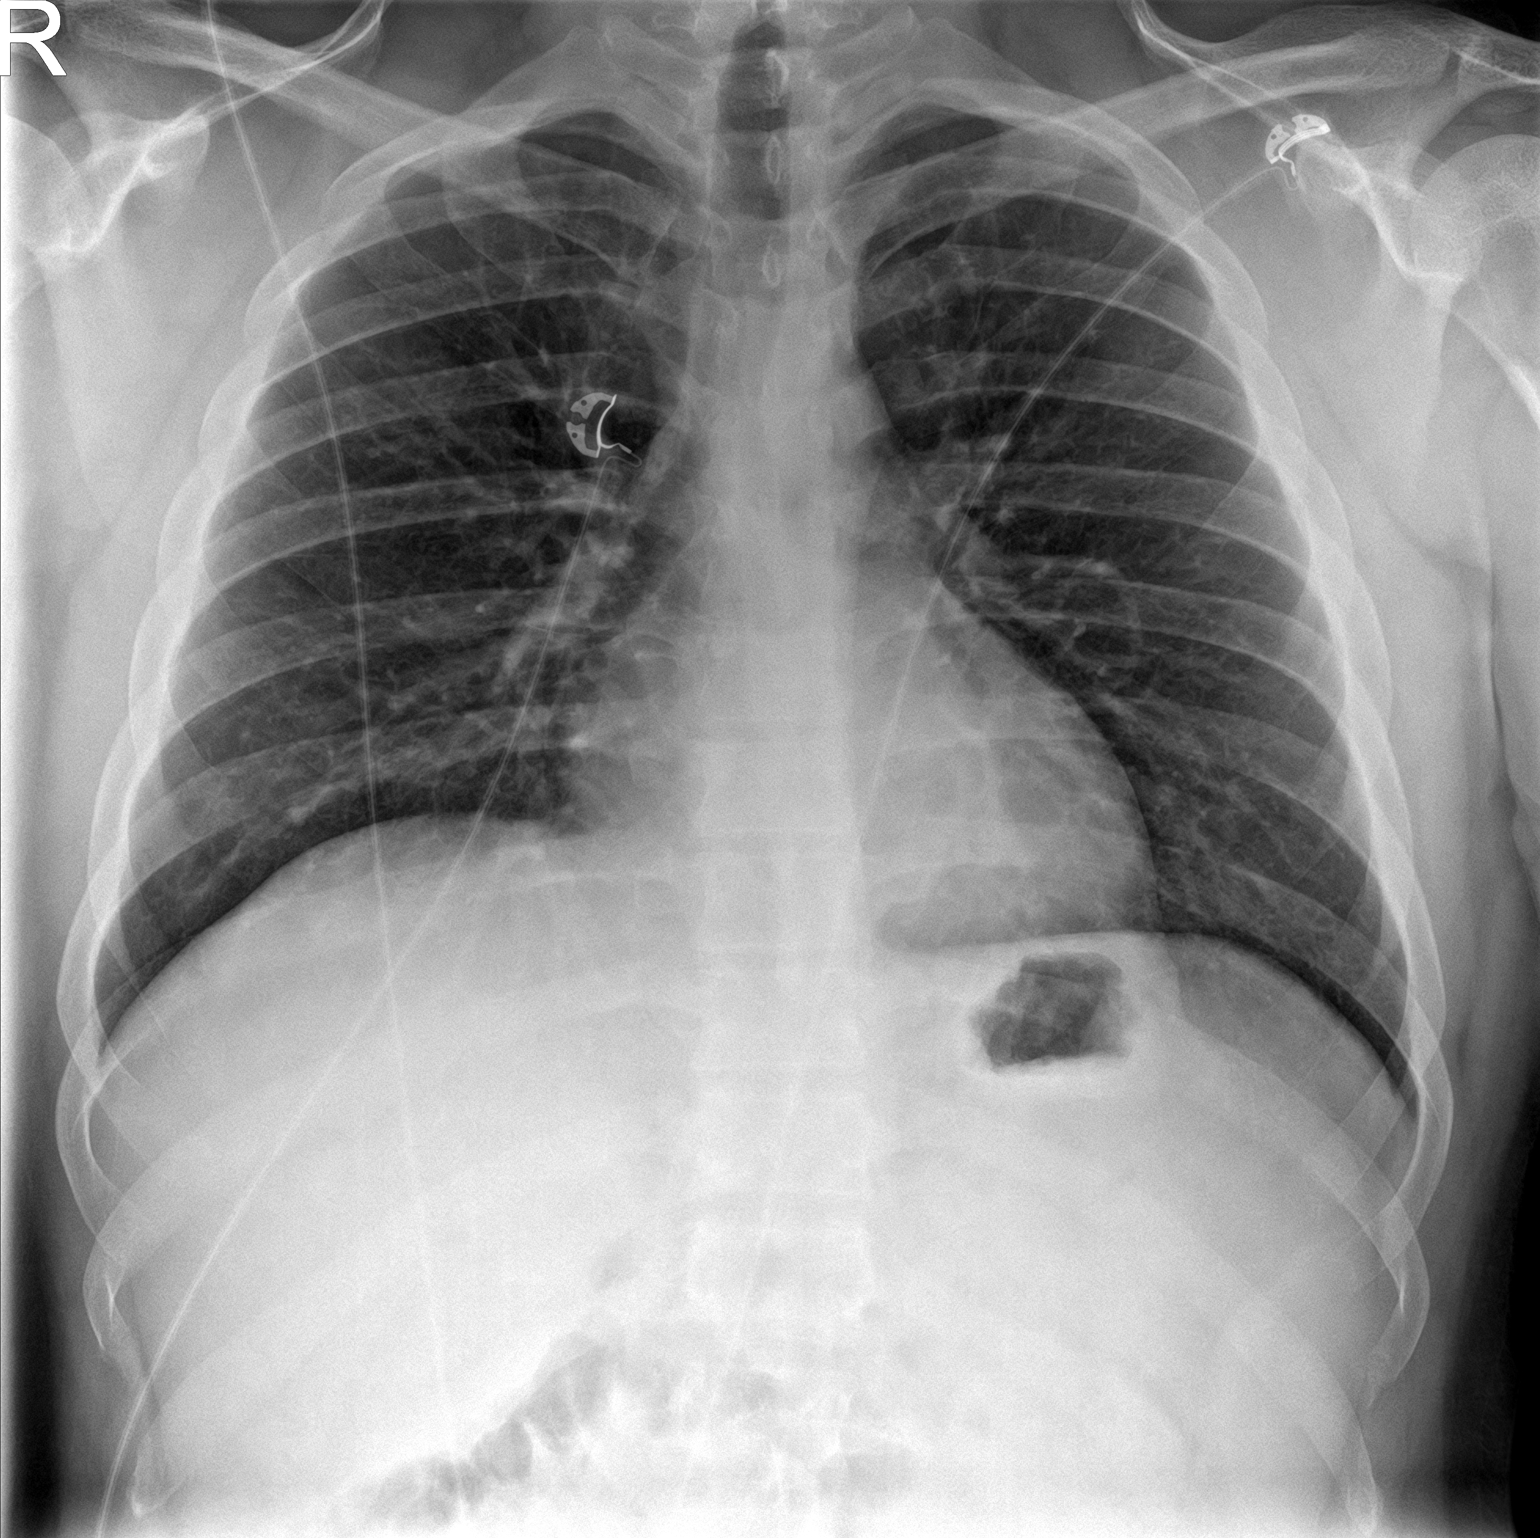

[chest lat]
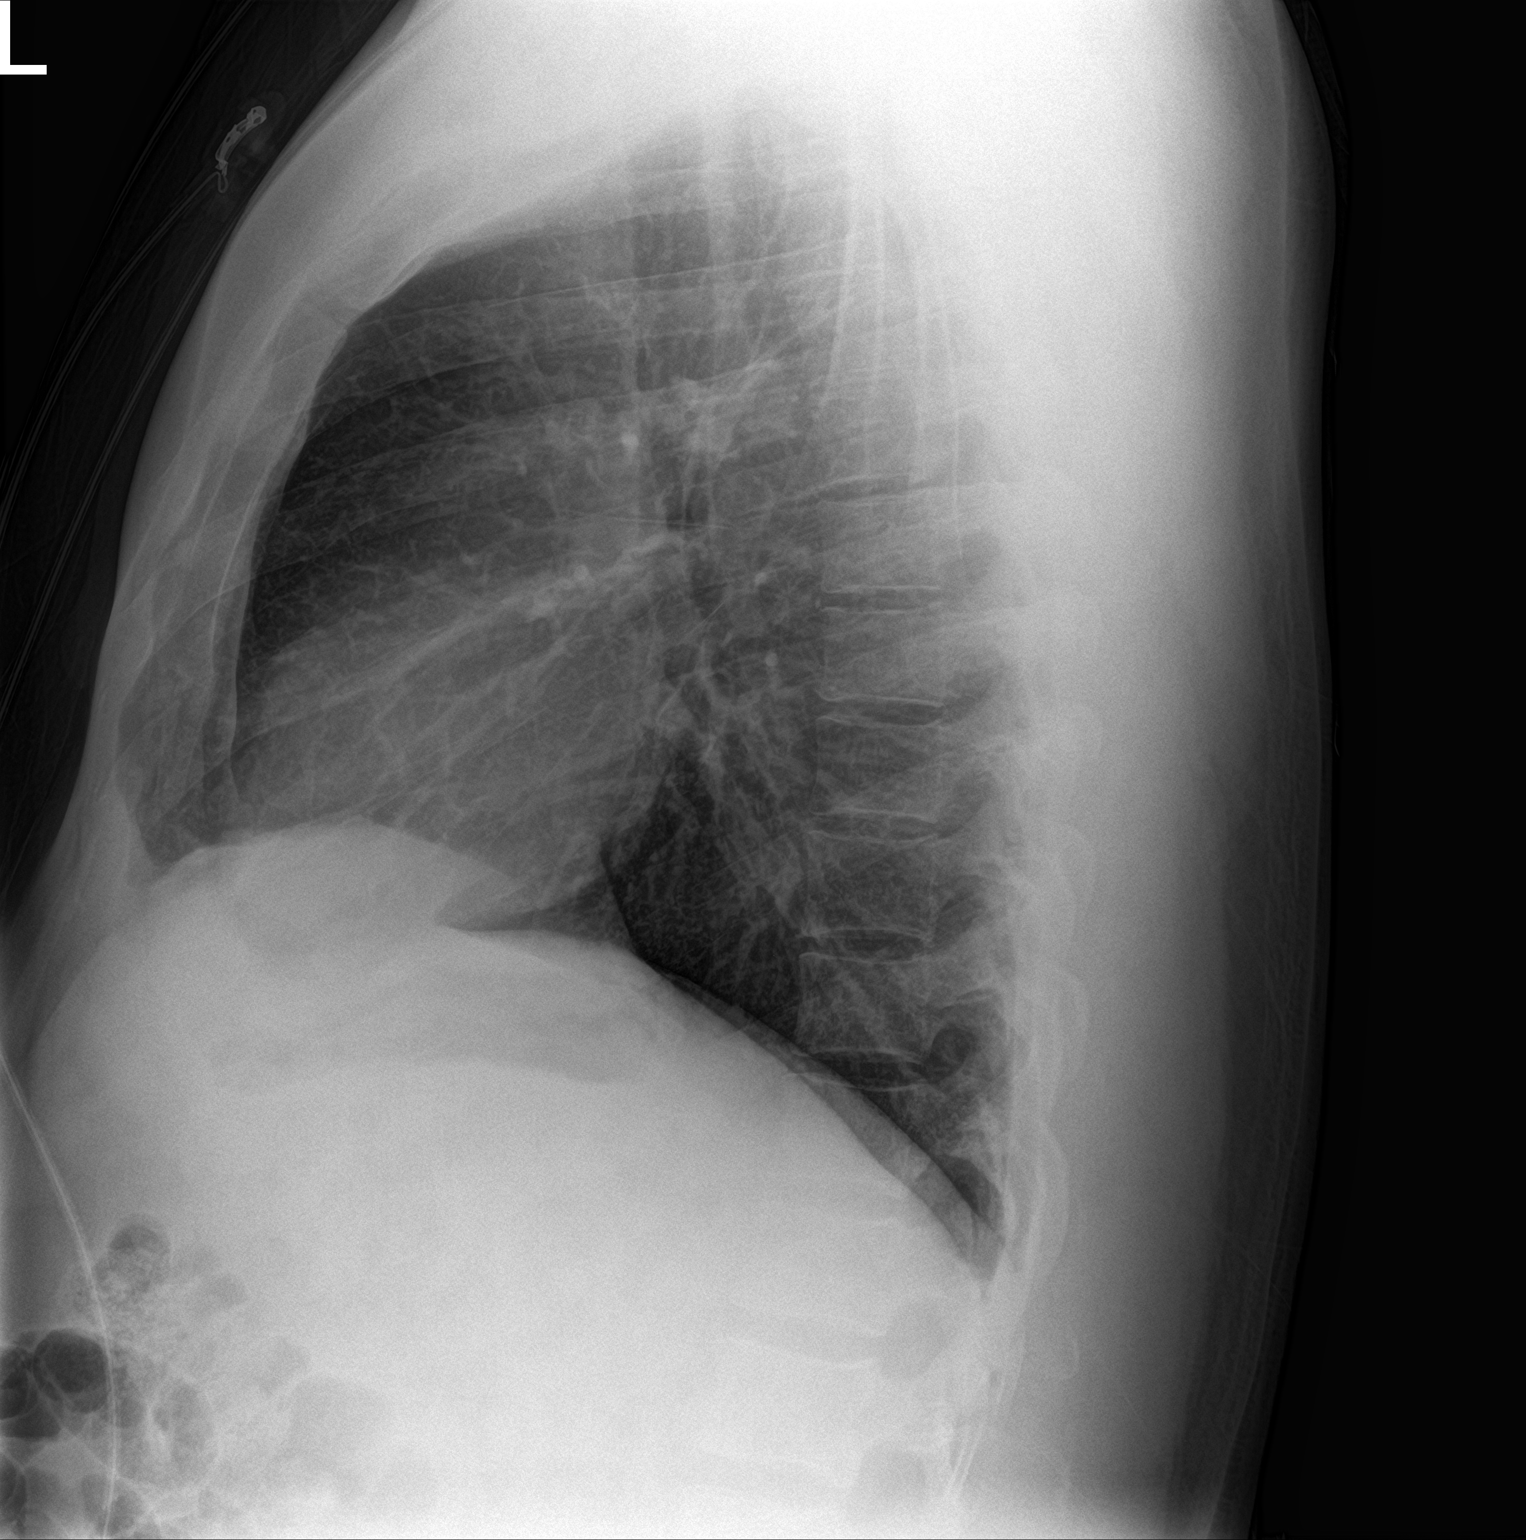

[2 of 2 positions shown; findings below may reference images not displayed]

FINDINGS: No consolidation, features of edema, pneumothorax, or effusion.
Pulmonary vascularity is normally distributed. The cardiomediastinal
contours are unremarkable. No acute osseous or soft tissue
abnormality.
IMPRESSION: No acute cardiopulmonary abnormality.

## 2021-02-18 IMAGING — US US ABDOMEN LIMITED
1 series · 14 of 25 positions shown · non-contrast
Comparison: 07/09/2018

CLINICAL DATA: Epigastric abdominal pain with nausea and vomiting

EXAM:
ULTRASOUND ABDOMEN LIMITED RIGHT UPPER QUADRANT

[Series 1: us abdomen limited · 14 of 45 slices shown]
[im 1/45]
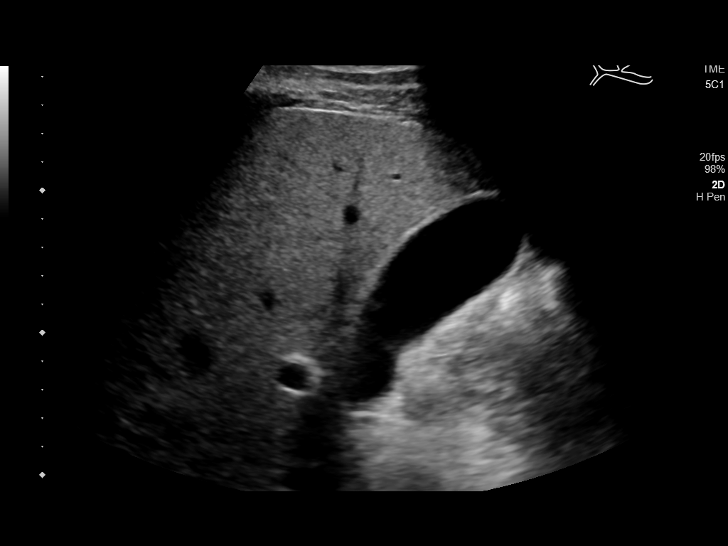
[im 4/45]
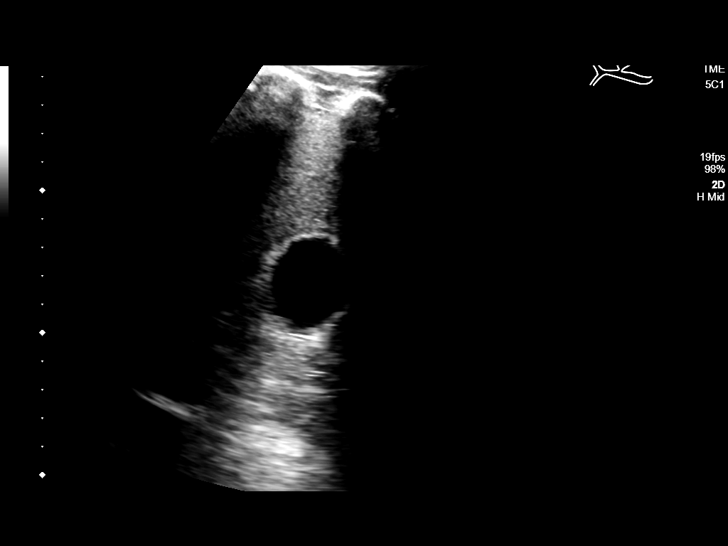
[im 8/45]
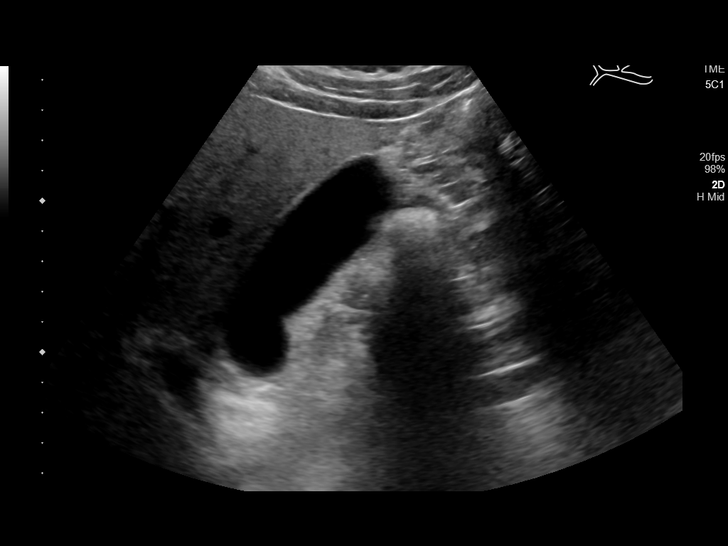
[im 12/45]
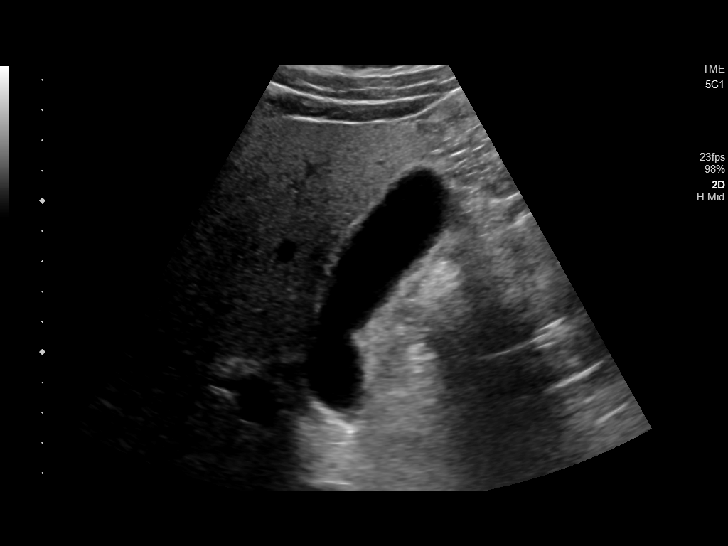
[im 15/45]
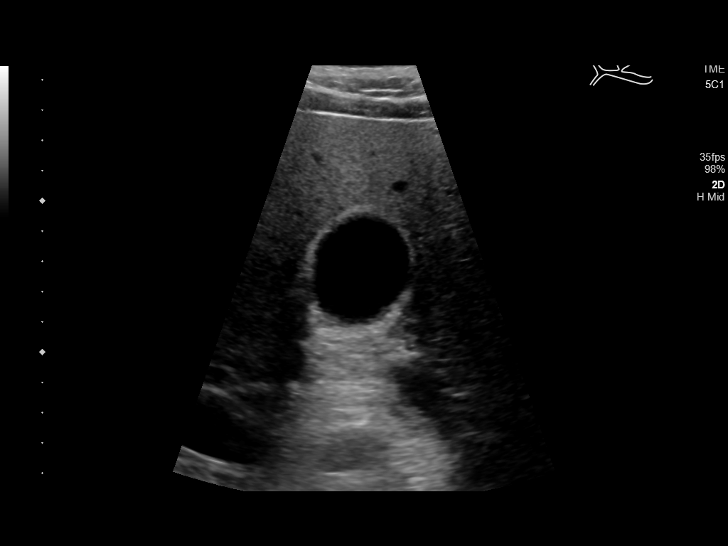
[im 17/45]
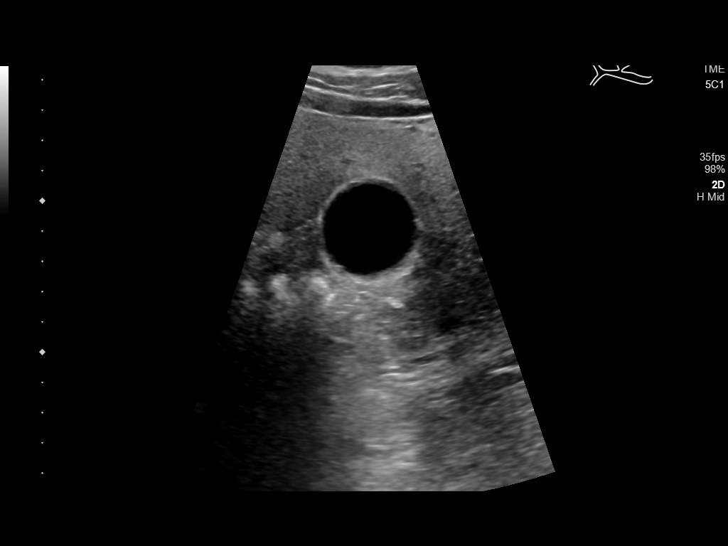
[im 21/45]
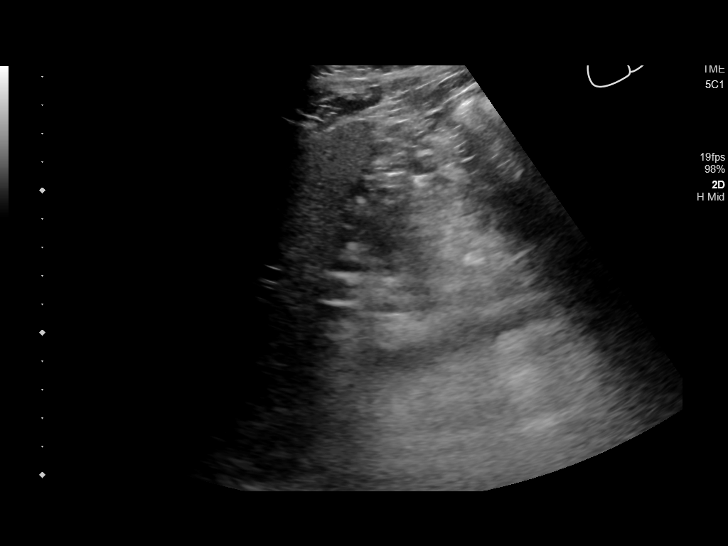
[im 24/45]
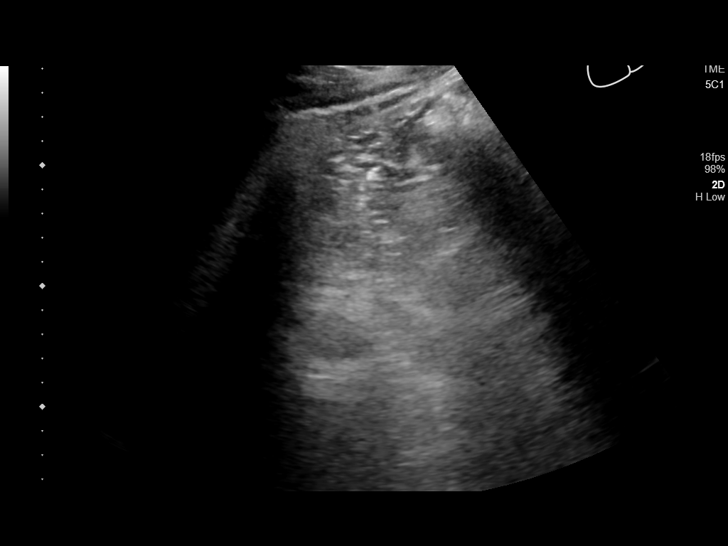
[im 28/45]
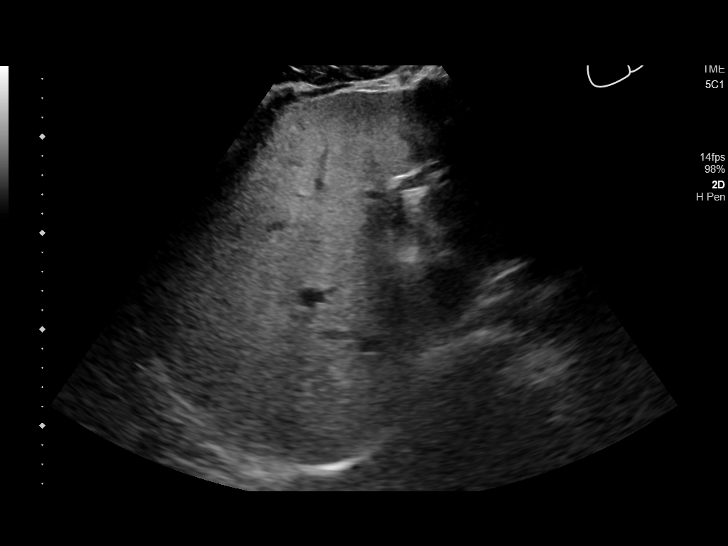
[im 30/45]
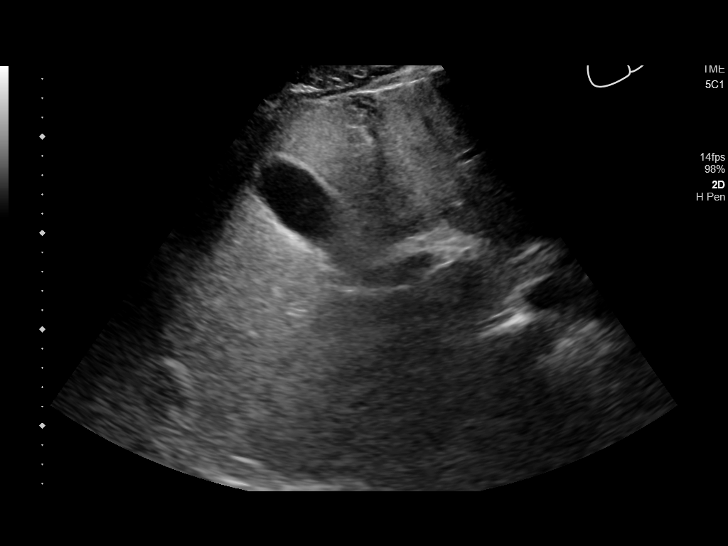
[im 34/45]
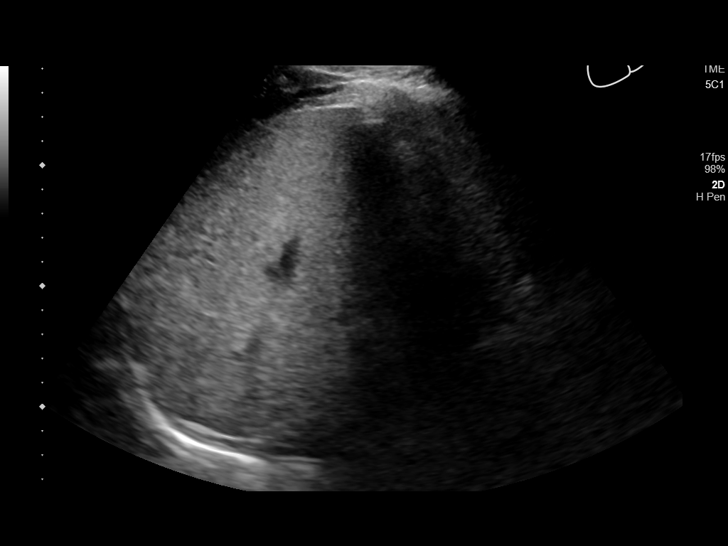
[im 37/45]
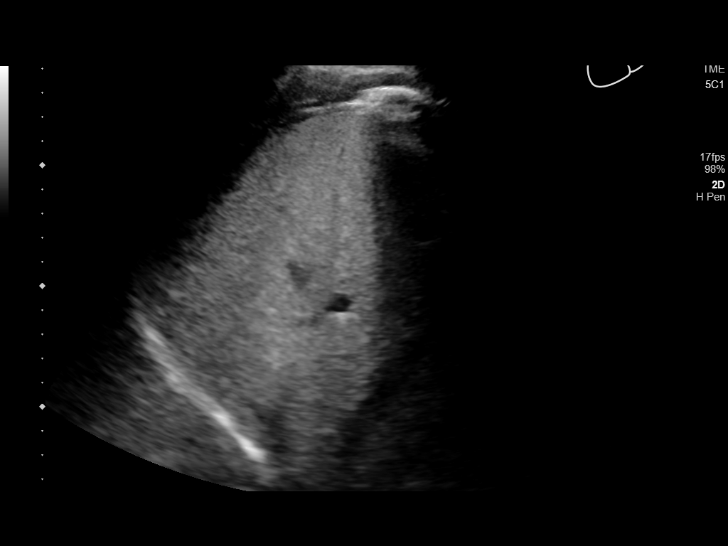
[im 41/45]
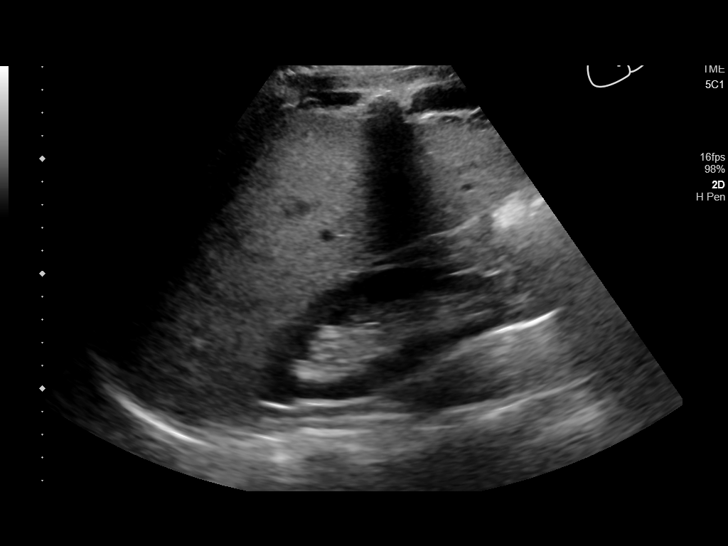
[im 45/45]
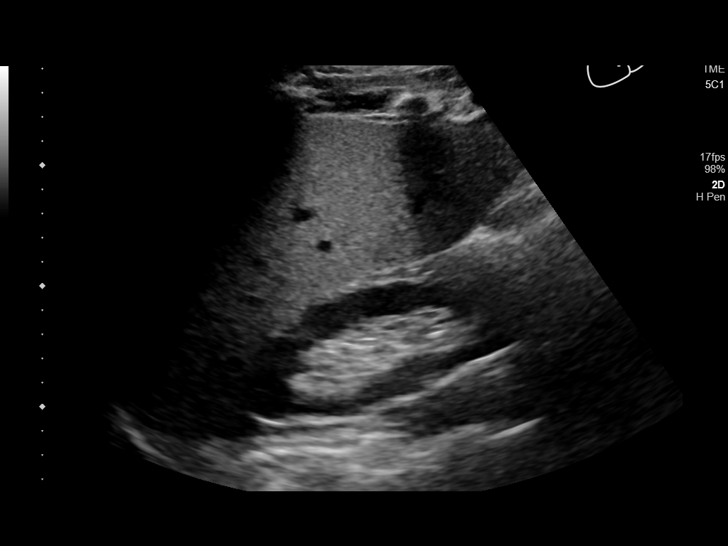

[14 of 25 positions shown; findings below may reference images not displayed]

FINDINGS: Gallbladder:

No gallstones or wall thickening visualized. No sonographic Murphy
sign noted by sonographer. Previously reported polyp is not seen
today

Common bile duct:

Diameter: 4 mm

Liver:

Increased liver echogenicity. No focal lesion. Portal vein is patent
on color Doppler imaging with normal direction of blood flow towards
the liver.
IMPRESSION: 1. No acute finding or gallstone.
2. Hepatic steatosis.

## 2021-10-27 ENCOUNTER — Ambulatory Visit
Admission: EM | Admit: 2021-10-27 | Discharge: 2021-10-27 | Disposition: A | Discharge status: Home / Self Care | Payer: Self-pay

## 2021-10-27 DIAGNOSIS — H6992 Unspecified Eustachian tube disorder, left ear: Secondary | ICD-10-CM

## 2021-10-27 DIAGNOSIS — R42 Dizziness and giddiness: Secondary | ICD-10-CM

## 2021-10-27 NOTE — ED Triage Notes (Signed)
Patient presents to [facility] for left ear pain and dizziness x 3 days. Has not taken anything for symptom relief.

## 2021-10-27 NOTE — ED Provider Notes (Signed)
Dean Schultz    CSN: [id number] Arrival date & time: 10/27/21  0802      History   Chief Complaint Chief Complaint  Patient presents with   Dizziness   Otalgia    Left ear pain     HPI Dean Schultz. is a 36 y.o. male.  Accompanied by his wife, patient presents with 2-3 day history of left ear pain.  He felt off-balance and lightheaded upon standing and when walking this morning.  No sensation of room spinning.  No associated symptoms.  No weakness, numbness, chest pain, palpitations, shortness of breath, nausea, vomiting, fever, ear drainage, sore throat, cough, or other symptoms.  No OTC medications taken.    The history is provided by the patient, the spouse and medical records.    Past Medical History:  Diagnosis Date   Gallstones     There are no problems to display for this patient.   History reviewed. No pertinent surgical history.     Home Medications    Prior to Admission medications   Medication Sig Start Date End Date Taking? Authorizing Provider  famotidine (PEPCID) 20 MG tablet Take 1 tablet (20 mg total) by mouth 2 (two) times daily. 07/09/18   Irean Hong, MD  pantoprazole (PROTONIX) 40 MG tablet Take 1 tablet (40 mg total) by mouth at bedtime. 11/01/18 11/01/19  Nita Sickle, MD    Family History History reviewed. No pertinent family history.  Social History Social History   Tobacco Use   Smoking status: Former   Smokeless tobacco: Never  Substance Use Topics   Alcohol use: Never   Drug use: Never     Allergies   Patient has no known allergies.   Review of Systems Review of Systems  Constitutional:  Negative for chills and fever.  HENT:  Positive for ear pain. Negative for congestion, ear discharge and sore throat.   Respiratory:  Negative for cough and shortness of breath.   Cardiovascular:  Negative for chest pain and palpitations.  Gastrointestinal:  Negative for abdominal pain, nausea and vomiting.  Skin:   Negative for rash.  Neurological:  Positive for light-headedness. Negative for syncope, facial asymmetry, speech difficulty, weakness, numbness and headaches.  All other systems reviewed and are negative.    Physical Exam Triage Vital Signs ED Triage Vitals  Enc Vitals Group     BP      Pulse      Resp      Temp      Temp src      SpO2      Weight      Height      Head Circumference      Peak Flow      Pain Score      Pain Loc      Pain Edu?      Excl. in GC?    No data found.  Updated Vital Signs BP 131/78   Pulse 71   Temp 98.4 F (36.9 C)   Resp 18   SpO2 97%   Visual Acuity Right Eye Distance:   Left Eye Distance:   Bilateral Distance:    Right Eye Near:   Left Eye Near:    Bilateral Near:     Physical Exam Vitals and nursing note reviewed.  Constitutional:      General: He is not in acute distress.    Appearance: He is well-developed. He is not ill-appearing.  HENT:  Right Ear: Tympanic membrane and ear canal normal.     Left Ear: Tympanic membrane and ear canal normal.     Nose: Nose normal.     Mouth/Throat:     Mouth: Mucous membranes are moist.     Pharynx: Oropharynx is clear.  Eyes:     Pupils: Pupils are equal, round, and reactive to light.  Cardiovascular:     Rate and Rhythm: Normal rate and regular rhythm.     Heart sounds: Normal heart sounds.  Pulmonary:     Effort: Pulmonary effort is normal. No respiratory distress.     Breath sounds: Normal breath sounds.  Musculoskeletal:     Cervical back: Neck supple.  Skin:    General: Skin is warm and dry.  Neurological:     General: No focal deficit present.     Mental Status: He is alert and oriented to person, place, and time.     Cranial Nerves: No cranial nerve deficit.     Sensory: No sensory deficit.     Motor: No weakness.     Gait: Gait normal.  Psychiatric:        Mood and Affect: Mood normal.        Behavior: Behavior normal.      UC Treatments / Results   Labs (all labs ordered are listed, but only abnormal results are displayed) Labs Reviewed - No data to display  EKG   Radiology No results found.  Procedures Procedures (including critical care time)  Medications Ordered in UC Medications - No data to display  Initial Impression / Assessment and Plan / UC Course  I have reviewed the triage vital signs and the nursing notes.  Pertinent labs & imaging results that were available during my care of the patient were reviewed by me and considered in my medical decision making (see chart for details).    Left eustachian tube dysfunction, Dizziness.  Patient declines EKG.  Treating with ibuprofen, Mucinex, Flonase.  Education provided on eustachian tube dysfunction and dizziness.  ED precautions discussed.  Instructed patient to follow up with his PCP if his symptoms are not improving.  He agrees to plan of care.    Final Clinical Impressions(s) / UC Diagnoses   Final diagnoses:  Dysfunction of left eustachian tube  Dizziness     Discharge Instructions      Take ibuprofen, plain Mucinex (guaifenesin 1200 mg every 12 hours), Flonase nasal spray as directed.    Follow up with your primary care provider if your symptoms are not improving.    Go to the emergency department if you have acute dizziness or other concerning symptoms.          ED Prescriptions   None    PDMP not reviewed this encounter.   Sharion Balloon, NP 10/27/21 [id number]

## 2021-10-27 NOTE — Discharge Instructions (Addendum)
Take ibuprofen, plain Mucinex (guaifenesin 1200 mg every 12 hours), Flonase nasal spray as directed.    Follow up with your primary care provider if your symptoms are not improving.    Go to the emergency department if you have acute dizziness or other concerning symptoms.
